# Patient Record
Sex: Male | Born: 1955 | Race: White | Hispanic: No | Marital: Married | Smoking: Former smoker
Health system: Southern US, Community
[De-identification: ages and names within clinical notes are randomized; demographics above are authoritative.]

## PROBLEM LIST (undated history)

## (undated) DIAGNOSIS — C801 Malignant (primary) neoplasm, unspecified: Secondary | ICD-10-CM

## (undated) DIAGNOSIS — I6521 Occlusion and stenosis of right carotid artery: Secondary | ICD-10-CM

## (undated) HISTORY — PX: CORONARY ANGIOPLASTY WITH STENT PLACEMENT: SHX49

---

## 2019-09-24 ENCOUNTER — Other Ambulatory Visit: Payer: Self-pay

## 2019-09-24 ENCOUNTER — Inpatient Hospital Stay (HOSPITAL_BASED_OUTPATIENT_CLINIC_OR_DEPARTMENT_OTHER)
Admission: EM | Admit: 2019-09-24 | Discharge: 2019-09-28 | DRG: 035 | Disposition: A | Discharge status: Home / Self Care | Attending: Internal Medicine | Admitting: Internal Medicine

## 2019-09-24 ENCOUNTER — Observation Stay (HOSPITAL_COMMUNITY)

## 2019-09-24 ENCOUNTER — Emergency Department (HOSPITAL_BASED_OUTPATIENT_CLINIC_OR_DEPARTMENT_OTHER)

## 2019-09-24 ENCOUNTER — Encounter (HOSPITAL_BASED_OUTPATIENT_CLINIC_OR_DEPARTMENT_OTHER): Payer: Self-pay | Admitting: *Deleted

## 2019-09-24 DIAGNOSIS — I6521 Occlusion and stenosis of right carotid artery: Secondary | ICD-10-CM | POA: Diagnosis present

## 2019-09-24 DIAGNOSIS — Z923 Personal history of irradiation: Secondary | ICD-10-CM | POA: Diagnosis not present

## 2019-09-24 DIAGNOSIS — Z87891 Personal history of nicotine dependence: Secondary | ICD-10-CM | POA: Diagnosis not present

## 2019-09-24 DIAGNOSIS — R1310 Dysphagia, unspecified: Secondary | ICD-10-CM | POA: Diagnosis not present

## 2019-09-24 DIAGNOSIS — Z85810 Personal history of malignant neoplasm of tongue: Secondary | ICD-10-CM | POA: Diagnosis present

## 2019-09-24 DIAGNOSIS — I2510 Atherosclerotic heart disease of native coronary artery without angina pectoris: Secondary | ICD-10-CM | POA: Diagnosis present

## 2019-09-24 DIAGNOSIS — R29701 NIHSS score 1: Secondary | ICD-10-CM | POA: Diagnosis present

## 2019-09-24 DIAGNOSIS — E785 Hyperlipidemia, unspecified: Secondary | ICD-10-CM

## 2019-09-24 DIAGNOSIS — Z79899 Other long term (current) drug therapy: Secondary | ICD-10-CM | POA: Diagnosis not present

## 2019-09-24 DIAGNOSIS — Z9221 Personal history of antineoplastic chemotherapy: Secondary | ICD-10-CM | POA: Diagnosis not present

## 2019-09-24 DIAGNOSIS — Z20822 Contact with and (suspected) exposure to covid-19: Secondary | ICD-10-CM | POA: Diagnosis not present

## 2019-09-24 DIAGNOSIS — N289 Disorder of kidney and ureter, unspecified: Secondary | ICD-10-CM

## 2019-09-24 DIAGNOSIS — D72829 Elevated white blood cell count, unspecified: Secondary | ICD-10-CM | POA: Diagnosis present

## 2019-09-24 DIAGNOSIS — Z955 Presence of coronary angioplasty implant and graft: Secondary | ICD-10-CM

## 2019-09-24 DIAGNOSIS — G453 Amaurosis fugax: Secondary | ICD-10-CM | POA: Diagnosis present

## 2019-09-24 DIAGNOSIS — G459 Transient cerebral ischemic attack, unspecified: Secondary | ICD-10-CM

## 2019-09-24 DIAGNOSIS — Z7982 Long term (current) use of aspirin: Secondary | ICD-10-CM

## 2019-09-24 DIAGNOSIS — I959 Hypotension, unspecified: Secondary | ICD-10-CM | POA: Diagnosis not present

## 2019-09-24 DIAGNOSIS — R7303 Prediabetes: Secondary | ICD-10-CM

## 2019-09-24 HISTORY — DX: Occlusion and stenosis of right carotid artery: I65.21

## 2019-09-24 HISTORY — DX: Malignant (primary) neoplasm, unspecified: C80.1

## 2019-09-24 LAB — COMPREHENSIVE METABOLIC PANEL
ALT: 14 U/L (ref 0–44)
AST: 17 U/L (ref 15–41)
Albumin: 3.6 g/dL (ref 3.5–5.0)
Alkaline Phosphatase: 70 U/L (ref 38–126)
Anion gap: 11 (ref 5–15)
BUN: 22 mg/dL (ref 8–23)
CO2: 24 mmol/L (ref 22–32)
Calcium: 9 mg/dL (ref 8.9–10.3)
Chloride: 104 mmol/L (ref 98–111)
Creatinine, Ser: 1.39 mg/dL — ABNORMAL HIGH (ref 0.61–1.24)
GFR calc Af Amer: >60 mL/min (ref >60)
GFR calc non Af Amer: 53 mL/min — ABNORMAL LOW (ref >60)
Glucose, Bld: 85 mg/dL (ref 70–99)
Potassium: 4 mmol/L (ref 3.5–5.1)
Sodium: 139 mmol/L (ref 135–145)
Total Bilirubin: 0.4 mg/dL (ref 0.3–1.2)
Total Protein: 7.2 g/dL (ref 6.5–8.1)

## 2019-09-24 LAB — PROTIME-INR
INR: 1 (ref 0.8–1.2)
Prothrombin Time: 12.7 seconds (ref 11.4–15.2)

## 2019-09-24 LAB — DIFFERENTIAL
Abs Immature Granulocytes: 0.06 10*3/uL (ref 0.00–0.07)
Basophils Absolute: 0 10*3/uL (ref 0.0–0.1)
Basophils Relative: 0 %
Eosinophils Absolute: 0.1 10*3/uL (ref 0.0–0.5)
Eosinophils Relative: 1 %
Immature Granulocytes: 1 %
Lymphocytes Relative: 13 %
Lymphs Abs: 1.7 10*3/uL (ref 0.7–4.0)
Monocytes Absolute: 1.1 10*3/uL — ABNORMAL HIGH (ref 0.1–1.0)
Monocytes Relative: 8 %
Neutro Abs: 10.2 10*3/uL — ABNORMAL HIGH (ref 1.7–7.7)
Neutrophils Relative %: 77 %

## 2019-09-24 LAB — CBG MONITORING, ED: Glucose-Capillary: 116 mg/dL — ABNORMAL HIGH (ref 70–99)

## 2019-09-24 LAB — CBC
HCT: 42.2 % (ref 39.0–52.0)
Hemoglobin: 13.8 g/dL (ref 13.0–17.0)
MCH: 31.9 pg (ref 26.0–34.0)
MCHC: 32.7 g/dL (ref 30.0–36.0)
MCV: 97.5 fL (ref 80.0–100.0)
Platelets: 317 10*3/uL (ref 150–400)
RBC: 4.33 MIL/uL (ref 4.22–5.81)
RDW: 13 % (ref 11.5–15.5)
WBC: 13.3 10*3/uL — ABNORMAL HIGH (ref 4.0–10.5)
nRBC: 0 % (ref 0.0–0.2)

## 2019-09-24 LAB — SARS CORONAVIRUS 2 BY RT PCR (HOSPITAL ORDER, PERFORMED IN ~~LOC~~ HOSPITAL LAB): SARS Coronavirus 2: NEGATIVE

## 2019-09-24 LAB — APTT: aPTT: 30 seconds (ref 24–36)

## 2019-09-24 LAB — ETHANOL: Alcohol, Ethyl (B): <10 mg/dL (ref <10)

## 2019-09-24 MED ORDER — ACETAMINOPHEN 650 MG RE SUPP: 650.0000 mg | RECTAL | Status: DC | PRN

## 2019-09-24 MED ORDER — ACETAMINOPHEN 325 MG PO TABS
650.0000 mg | ORAL_TABLET | ORAL | Status: DC | PRN
  Administered 2019-09-26 – 2019-09-27 (×2): 650 mg via ORAL
  Filled 2019-09-24 (×2): qty 2

## 2019-09-24 MED ORDER — ASPIRIN 81 MG PO CHEW
81.0000 mg | CHEWABLE_TABLET | Freq: Every day | ORAL | Status: DC
  Administered 2019-09-25 – 2019-09-28 (×3): 81 mg via ORAL
  Filled 2019-09-24 (×3): qty 1

## 2019-09-24 MED ORDER — SENNOSIDES-DOCUSATE SODIUM 8.6-50 MG PO TABS: 1.0000 | ORAL_TABLET | Freq: Every evening | ORAL | Status: DC | PRN

## 2019-09-24 MED ORDER — ENOXAPARIN SODIUM 40 MG/0.4ML ~~LOC~~ SOLN
40.0000 mg | SUBCUTANEOUS | Status: DC
  Administered 2019-09-24 – 2019-09-27 (×4): 40 mg via SUBCUTANEOUS
  Filled 2019-09-24 (×4): qty 0.4

## 2019-09-24 MED ORDER — STROKE: EARLY STAGES OF RECOVERY BOOK
Freq: Once | Status: AC
  Filled 2019-09-24: qty 1

## 2019-09-24 MED ORDER — ACETAMINOPHEN 160 MG/5ML PO SOLN: 650.0000 mg | ORAL | Status: DC | PRN

## 2019-09-24 MED ORDER — ASPIRIN 81 MG PO CHEW
324.0000 mg | CHEWABLE_TABLET | Freq: Once | ORAL | Status: AC
  Administered 2019-09-24: 324 mg via ORAL
  Filled 2019-09-24: qty 4

## 2019-09-24 MED ORDER — SODIUM CHLORIDE 0.9 % IV SOLN: INTRAVENOUS | Status: AC

## 2019-09-24 MED ORDER — GADOBUTROL 1 MMOL/ML IV SOLN
6.0000 mL | Freq: Once | INTRAVENOUS | Status: AC | PRN
  Administered 2019-09-24: 6 mL via INTRAVENOUS

## 2019-09-24 NOTE — Progress Notes (Signed)
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 Leaving patient NPO until speech assesses further.

## 2019-09-24 NOTE — H&P (Signed)
History and Physical    Peter Jordan YJE:[id number] DOB: [birth year] DOA: 09/24/2019  PCP: Patient, No Pcp Per   Patient coming from: Home   Chief Complaint: Vision loss in right eye   HPI: Peter Jordan is a 64 y.o. male with medical history significant for tongue cancer, now presenting to the emergency department for evaluation of vision deficit.  Patient reports that he has been experiencing frequent episodes of right facial numbness that he attributes to prior neck surgeries but then had an episode today involving loss of right eye vision.  He describes loss of upper visual field with the right eye that lasted approximately 15 minutes before resolving.  He has had this previously, most recently 1 month ago, also involving the right eye and lasting 15 to 20 minutes.  He denies any chest pain or palpitations.  He denies any focal numbness or weakness other than the chronic right facial numbness.  He denies any headache.  Patient denies fevers, chills, cough, or shortness of breath.  [facility] ED Course: Upon arrival to the ED, patient is found to be afebrile, saturating well on room air, and with stable blood pressure.  EKG features sinus tachycardia with rate 106.  Head CT is negative for hemorrhage or infarction but notable for prominent CSF at the convexities which could be a normal variant or subdural hygromas.  Chemistry panel notable for creatinine 1.39, up from 0.9 more than a year ago.  CBC notable for leukocytosis to 13,300.  Covid PCR was negative.  Teleneurology recommended admission for CVA/TIA work-up.  Patient was treated with aspirin and transported to [facility].  Review of Systems:  All other systems reviewed and apart from HPI, are negative.  Past Medical History:  Diagnosis Date  . Cancer [facility])     Past Surgical History:  Procedure Laterality Date  . CORONARY ANGIOPLASTY WITH STENT PLACEMENT      Social History:   reports that he has never  smoked. He has never used smokeless tobacco. He reports previous alcohol use. He reports that he does not use drugs.  Allergies  Allergen Reactions  . Penicillins     History reviewed. No pertinent family history.   Prior to Admission medications   Not on File    Physical Exam: Vitals:   09/24/19 1600 09/24/19 1615 09/24/19 1700 09/24/19 1801  BP: (!) 157/80 (!) 147/80 (!) 159/84 (!) 171/79  Pulse: 80 65 67 71  Resp: 13 14 13 16   Temp:    97.7 F (36.5 C)  TempSrc:    Oral  SpO2: 98% 98% 99% 99%  Weight:      Height:        Constitutional: NAD, calm  Eyes: PERTLA, lids and conjunctivae normal ENMT: Mucous membranes are moist. Posterior pharynx clear of any exudate or lesions.   Neck: normal, supple, no masses, no thyromegaly Respiratory: no wheezing, no crackles. No accessory muscle use.  Cardiovascular: S1 & S2 heard, regular rate and rhythm. No extremity edema.   Abdomen: No distension, no tenderness, soft. Bowel sounds active.  Musculoskeletal: no clubbing / cyanosis. No joint deformity upper and lower extremities.   Skin: no significant rashes, lesions, ulcers. Warm, dry, well-perfused. Neurologic: CN 2-12 grossly intact. Sensation to light touch diminished in right face and neck. Strength 5/5 in all 4 limbs.  Psychiatric: Alert and oriented to person, place, and situation. Pleasant and cooperative.    Labs and Imaging on Admission: I have personally reviewed following  labs and imaging studies  CBC: Recent Labs  Lab 09/24/19 1258  WBC 13.3*  NEUTROABS 10.2*  HGB 13.8  HCT 42.2  MCV 97.5  PLT 154   Basic Metabolic Panel: Recent Labs  Lab 09/24/19 1258  NA 139  K 4.0  CL 104  CO2 24  GLUCOSE 85  BUN 22  CREATININE 1.39*  CALCIUM 9.0   GFR: Estimated Creatinine Clearance: 47.5 mL/min (A) (by C-G formula based on SCr of 1.39 mg/dL (H)). Liver Function Tests: Recent Labs  Lab 09/24/19 1258  AST 17  ALT 14  ALKPHOS 70  BILITOT 0.4  PROT 7.2    ALBUMIN 3.6   No results for input(s): LIPASE, AMYLASE in the last 168 hours. No results for input(s): AMMONIA in the last 168 hours. Coagulation Profile: Recent Labs  Lab 09/24/19 1258  INR 1.0   Cardiac Enzymes: No results for input(s): CKTOTAL, CKMB, CKMBINDEX, TROPONINI in the last 168 hours. BNP (last 3 results) No results for input(s): PROBNP in the last 8760 hours. HbA1C: No results for input(s): HGBA1C in the last 72 hours. CBG: Recent Labs  Lab 09/24/19 1229  GLUCAP 116*   Lipid Profile: No results for input(s): CHOL, HDL, LDLCALC, TRIG, CHOLHDL, LDLDIRECT in the last 72 hours. Thyroid Function Tests: No results for input(s): TSH, T4TOTAL, FREET4, T3FREE, THYROIDAB in the last 72 hours. Anemia Panel: No results for input(s): VITAMINB12, FOLATE, FERRITIN, TIBC, IRON, RETICCTPCT in the last 72 hours. Urine analysis: No results found for: COLORURINE, APPEARANCEUR, LABSPEC, PHURINE, GLUCOSEU, HGBUR, BILIRUBINUR, KETONESUR, PROTEINUR, UROBILINOGEN, NITRITE, LEUKOCYTESUR Sepsis Labs: @LABRCNTIP (procalcitonin:4,lacticidven:4) ) Recent Results (from the past 240 hour(s))  SARS Coronavirus 2 by RT PCR (hospital order, performed in [facility] hospital lab) Nasopharyngeal Nasopharyngeal Swab     Status: None   Collection Time: 09/24/19 12:58 PM   Specimen: Nasopharyngeal Swab  Result Value Ref Range Status   SARS Coronavirus 2 NEGATIVE NEGATIVE Final    Comment: (NOTE) SARS-CoV-2 target nucleic acids are NOT DETECTED.  The SARS-CoV-2 RNA is generally detectable in upper and lower respiratory specimens during the acute phase of infection. The lowest concentration of SARS-CoV-2 viral copies this assay can detect is 250 copies / mL. A negative result does not preclude SARS-CoV-2 infection and should not be used as the sole basis for treatment or other patient management decisions.  A negative result may occur with improper specimen collection / handling, submission of  specimen other than nasopharyngeal swab, presence of viral mutation(s) within the areas targeted by this assay, and inadequate number of viral copies (<250 copies / mL). A negative result must be combined with clinical observations, patient history, and epidemiological information.  Fact Sheet for Patients:   StrictlyIdeas.no  Fact Sheet for Healthcare Providers: BankingDealers.co.za  This test is not yet approved or  cleared by the [place] FDA and has been authorized for detection and/or diagnosis of SARS-CoV-2 by FDA under an Emergency Use Authorization (EUA).  This EUA will remain in effect (meaning this test can be used) for the duration of the COVID-19 declaration under Section 564(b)(1) of the Act, 21 U.S.C. section 360bbb-3(b)(1), unless the authorization is terminated or revoked sooner.  Performed at [facility], [address] [place], [place] [address]      Radiological Exams on Admission: CT HEAD CODE STROKE WO CONTRAST  Result Date: 09/24/2019 CLINICAL DATA:  Code stroke. Sudden onset numbness to the right-sided face beginning this morning EXAM: CT HEAD WITHOUT CONTRAST TECHNIQUE: Contiguous axial  images were obtained from the base of the skull through the vertex without intravenous contrast. COMPARISON:  None. FINDINGS: Brain: No evidence of acute infarction, hemorrhage, hydrocephalus, or mass lesion/mass effect. Prominent CSF along cerebral convexities. There could be trace subdural hygroma on both sides towards the vertex, but no definite cortical vein mass effect. Vascular: No hyperdense vessel or unexpected calcification. Skull: Normal. Negative for fracture or focal lesion. Sinuses/Orbits: No acute finding. Other: These results were called by telephone at the time of interpretation on 09/24/2019 at 12:44 pm to provider [facility] , who verbally acknowledged these results. ASPECTS [facility] Stroke Program Early  CT Score) - Ganglionic level infarction (caudate, lentiform nuclei, internal capsule, insula, M1-M3 cortex): 7 - Supraganglionic infarction (M4-M6 cortex): 3 Total score (0-10 with 10 being normal): 10 IMPRESSION: 1. Negative for hemorrhage or visible infarct.ASPECTS is 10. 2. Prominent CSF at the convexities, normal variant versus trace subdural hygromas. Electronically Signed   By: Monte Fantasia M.D.   On: 09/24/2019 12:46    EKG: Independently reviewed. Sinus tachycardia, rate 106, non-specific ST-T abnormality.   Assessment/Plan   1. Transient monocular vision loss  - Presents after a 15-minute episode of vision loss involving right eye, was seen at [facility] with no acute hemorrhage or appreciable infarction on head CT, was treated with ASA, and now sent to [facility] for TIA/CVA workup per teleneuro recs  - He passed swallow screen on arrival to [facility]  - Continue neuro checks, cardiac monitoring  - Check MRI brain, MRA head, echocardiogram, carotid US, fasting lipids, and A1c  - Consult PT/OT/SLP, continue ASA    2. Mild renal insufficiency  - SCr is 1.39 on admission, up from 0.9 in Feb 2020  - Renally-dose medications, continue IVF hydration overnight, repeat chem panel in am     DVT prophylaxis: Lovenox  Code Status: Full  Family Communication: Discussed with patient  Disposition Plan:  Patient is from: Home  Anticipated d/c is to: Home  Anticipated d/c date is: 09/25/19 Patient currently: Pending CVA/TIA workup  Consults called: Neurology   Admission status: Observation     Vianne Bulls, MD [facility]  09/24/2019, 8:26 PM

## 2019-09-24 NOTE — Progress Notes (Signed)
[facility] to [facility] transfer:  Patient with h/o base on tongue cancer presenting to [facility] with c/o R-sided facial numbness and R visual field changes.  Concern for TIA.  Acute onset visual disturbance within an hour or two of presentation.  Teleneurologist recommends admission for TIA/CVA evaluation.  Symptoms resolved while on the teleneurology visit, she was concerned about amaurosis fugax.  Will need transfer to [facility] when bed is available - if unable to get transferred in a timely manner, it would be reasonable to send ER:ER for MRI/MRA and if negative could potentially avoid need for admission.   Carlyon Shadow, M.D.

## 2019-09-24 NOTE — ED Notes (Signed)
ED Provider at bedside. 

## 2019-09-24 NOTE — Consult Note (Signed)
[facility] TeleNeurology Consult Services   Date of Service:   09/24/2019 12:44:43  Impression:       G45.3 - Amaurosis fugax  Comments/Sign-Out: the patient has some right-sided neck numbness but this is been ongoing for the past month and is likely unrelated. Today he describes what sounds like transient monocular vision loss consistent with amaurosis. His vision is now back to baseline. Certainly stroke workup. Recommend starting aspirin. Inpatient neurology consultation. Thankfully he feels like his symptoms are resolved with the exception of some neck numbness which again is not new  Metrics: Last Known Well: 09/24/2019 11:00:00 [facility] Notification Time: 09/24/2019 12:44:43 Arrival Time: 09/24/2019 12:16:00 Stamp Time: 09/24/2019 12:44:43 Time First Login Attempt: 09/24/2019 12:49:03 Symptoms: right-sided vision change. NIHSS Start Assessment Time: 09/24/2019 12:51:47 Patient is not a candidate for Thrombolytic. Thrombolytic Medical Decision: 09/24/2019 13:05:39 Patient was not deemed candidate for Thrombolytic because of following reasons: No disabling symptoms.  CT head showed no acute hemorrhage or acute core infarct.  Radiologist was not called back for review of advanced imaging. ED Physician notified of diagnostic impression and management plan on 09/24/2019 13:07:57  Advanced Imaging: Advanced Imaging Not Recommended because:  Clinical Presentation is not Suggestive of LVO and NIHSS is <6   Our recommendations are outlined below.  Recommendations:       Activate Stroke Protocol Admission/Order Set       Stroke/Telemetry Floor       Neuro Checks       Bedside Swallow Eval       DVT Prophylaxis       IV Fluids, Normal Saline       Head of Bed 30 Degrees       Euglycemia and Avoid Hyperthermia (PRN Acetaminophen)       Initiate Aspirin 81 MG Daily  Routine Consultation with [facility] for Follow up Care  Sign Out:        Discussed with Emergency Department Provider    ------------------------------------------------------------------------------  History of Present Illness: Patient is a 64 year old Male.  Patient was brought by private transportation with symptoms of right-sided vision change.  At 11am sudden right sided facial numbness and vision changes. He has CAD s/p stents. Had cancer 7years ago in teh neck. had ulcer in this He has what sounds like a right eye amourosis but also some. Hes had some intermittent numbness to the right face/nekc ofr the past month. the patient is a pleasant 64 year old gentleman who has a known history of coronary artery disease not on any antiplatelet secondary to gastric ulcers in the past. He's had some intermittent right-sided neck and face numbness for the past month. Today at 11 AM he developed right-sided vision change which she describes as a darkness coming down in the right eye only able to see the lower portion of the vision. He does not feel like the left eye was affected.   Past Medical History:      Coronary Artery Disease      There is NO history of Hypertension      There is NO history of Diabetes Mellitus      There is NO history of Hyperlipidemia      There is NO history of Atrial Fibrillation      There is NO history of Stroke  Anticoagulant use:  No  Antiplatelet use: No    Examination: BP(65/85), Pulse(75), Blood Glucose(116) 1A: Level of Consciousness - Alert; keenly responsive + 0 1B: Ask Month and Age - Both  Questions Right + 0 1C: Blink Eyes & Squeeze Hands - Performs Both Tasks + 0 2: Test Horizontal Extraocular Movements - Normal + 0 3: Test Visual Fields - No Visual Loss + 0 4: Test Facial Palsy (Use Grimace if Obtunded) - Normal symmetry + 0 5A: Test Left Arm Motor Drift - No Drift for 10 Seconds + 0 5B: Test Right Arm Motor Drift - No Drift for 10 Seconds + 0 6A: Test Left Leg Motor Drift - No Drift for 5 Seconds + 0 6B:  Test Right Leg Motor Drift - No Drift for 5 Seconds + 0 7: Test Limb Ataxia (FNF/Heel-Shin) - No Ataxia + 0 8: Test Sensation - Mild-Moderate Loss: Less Sharp/More Dull + 1 9: Test Language/Aphasia - Normal; No aphasia + 0 10: Test Dysarthria - Normal + 0 11: Test Extinction/Inattention - No abnormality + 0  NIHSS Score: 1  Pre-Morbid Modified Rankin Scale: 0 Points = No symptoms at all   Patient/Family was informed the Neurology Consult would occur via TeleHealth consult by way of interactive audio and video telecommunications and consented to receiving care in this manner.   Patient is being evaluated for possible acute neurologic impairment and high probability of imminent or life-threatening deterioration. I spent total of 35 minutes providing care to this patient, including time for face to face visit via telemedicine, review of medical records, imaging studies and discussion of findings with providers, the patient and/or family.   Dr Katina Degree   [facility] [phone]  Case [id number]

## 2019-09-24 NOTE — ED Triage Notes (Addendum)
Pt reports sudden onset numbness to the right side of his face at 11am. No droop with smile, slight right eye droop without eye weakness.  Pt reports dizziness and CP that began at the same time. Also reports visual field changes in right eye.

## 2019-09-24 NOTE — Consult Note (Addendum)
Neurology Consultation Reason for Consult: Transient vision loss Referring Physician: Christia Reading Opyd  CC: Loss of vision in the right eye, transient.   History is obtained from: Patient   HPI: Peter Jordan is a 64 y.o. male with a PMHx significant for CAD (s/p right coronary stent in 2011), Tongue cancer s/p resection including bilateral neck surgeries (2013) and an 8-week course of chemotherapy/radiation (2014, involved bilateral neck), GI bleed felt to be secondary to aspirin and ibuprofen (requiring hospitalization and 4 units of blood and 2012, 2 units of blood in 2017), 30-pack-year smoking history (quit in 2013).  He presents with an episode of right eye vision loss described as a curtain falling halfway down his eye lasting 15 to 20 minutes before resolving.  He feels that this may have happened 1-2 times additionally over the past 2 weeks but also short-lived and resolved with rubbing his eye that he did not think about it until today.   He otherwise has some longstanding tingling of the right side of his face/neck when he turns his head to the right.  This is unchanged.  He additionally has chronic tinnitus and a stable cough.  Otherwise his review of systems is negative for any difficulty with his speech, swallow, double vision, headaches (had a brief typical headache this morning for which he took Tylenol which he attributes to computer use and happens regularly 2-3 times a month), congestion, palpitations, chest pain, GI/GU symptoms including diarrhea/constipation/hematuria/dysuria, rashes, fevers, chills, proximal muscle weakness, jaw claudication, scalp tenderness  Vitals notable for BP 140s-170s/80s-90s  LKW: 11 AM on 9/6  tPA given?: No, due to symptoms fully resolved and out of the window   ROS: A 14 point ROS was performed and is negative except as noted in the HPI.   Past Medical History:  Diagnosis Date  . Cancer [facility])   -See details above in HPI  History reviewed. No  pertinent family history.  Social History:  reports that he has never smoked. He has never used smokeless tobacco. He reports previous alcohol use. He reports that he does not use drugs.  Exam: Current vital signs: BP (!) 171/79 (BP Location: Left Arm)   Pulse 71   Temp 97.7 F (36.5 C) (Oral)   Resp 16   Ht _0  (1.702 m)   Wt 62.6 kg   SpO2 99%   BMI 21.61 kg/m  Vital signs in last 24 hours: Temp:  [97.7 F (36.5 C)-97.9 F (36.6 C)] 97.7 F (36.5 C) (09/06 1801) Pulse Rate:  [65-96] 71 (09/06 1801) Resp:  [10-27] 16 (09/06 1801) BP: (125-177)/(78-98) 171/79 (09/06 1801) SpO2:  [96 %-99 %] 99 % (09/06 1801) Weight:  [62.6 kg] 62.6 kg (09/06 1225)  Physical Exam  Constitutional: Appears well-developed and well-nourished.  Psych: Affect appropriate to situation, cooperative and pleasant, jovial  Eyes: No scleral injection HENT: No OP obstruction  MSK: right pinkie flexion deformity and other milder deformities of right hand fingers Cardiovascular: Normal rate and regular rhythm.  Respiratory: Effort normal, non-labored breathing GI: Soft.  No distension. There is no tenderness.  Skin: minor bruising  Neuro: Mental Status: Patient is awake, alert, oriented to person, place, month, year, and situation. Patient is able to give a clear and coherent history. No signs of aphasia or neglect, able to quickly name days of the week backwards, follow complex commands, name and repeat.  Cranial Nerves: II: Visual Fields are full. Pupils are equal, round, and reactive to light. No APD  III,IV,  VI: EOMI without ptosis or diploplia.  V: Facial sensation is reduced in the right V2/V3 distributions (chronic) VII: Facial movement is symmetric.  VIII: hearing is intact to voice X: Uvula absent XI: Shoulder shrug is symmetric. XII: tongue is twisted slightly more to the left than the right.  Motor: Tone is normal. Bulk is normal. 5/5 strength was present in all four extremities  other than right pinkie  Sensory: Sensation is symmetric to light touch in the arms and legs, some stocking/glove loss of temperature sensation symmetric in bilateral upper and lower extremities.  Deep Tendon Reflexes: 3+ and brisker on the right in biceps and brachioradialis Ticklish full body jerk to bilateral patellar reflexes but likely also brisk 2+ patellars Plantars Toes are downgoing bilaterally.  Cerebellar: FNF and HKS with mild end reach past pointing on the right arm only  I have reviewed labs in epic and the results pertinent to this consultation are: Cr 1.39, GFR 53, WBC 13.3  I have reviewed the images obtained:   MRI brain and MRA head and neck   - chronic microvascular disease more marked on the left side  - severe stenosis of the right carotid   Impression: Mr. Mehring is a 64 year old gentleman with multiple stroke risk factors as detailed above presenting with transient loss of vision in the right eye.  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  He does not complain about symptoms suggestive of giant cell arteritis, but will also check an ESR (notably an elevated ESR would also be potentially concerning for recurrent malignancy).   Recommendations: - ESR, HgbA1c, fasting lipid panel - MRI, MRA of the brain without contrast, MRA neck w/ and w/o contrast  - Carotid duplex to confirm stenosis - Frequent neuro checks - Echocardiogram - Antiplatelet med: Aspirin - dose 36m PO received at 2:40 PM on 9/6, continue 81 mg daily - Start statin pending lipid panel (40-80 mg atorvastatin), goal LDL less than 70 - Risk factor modification - Telemetry monitoring - PT consult, OT consult, Speech consult not indicated given back to baseline - Consult vascular surgery in the morning - Stroke team to follow  [phone]

## 2019-09-24 NOTE — ED Provider Notes (Signed)
[facility] Provider Note   CSN: [id number] Arrival date & time: 09/24/19  1216  An emergency department physician performed an initial assessment on this suspected stroke patient at 1242.  History Chief Complaint  Patient presents with  . Code Stroke    Peter Jordan is a 64 y.o. male.  HPI   Patient presented to the ED for evaluation of acute onset of facial numbness and visual disturbance.  Patient states his symptoms started about 11 AM this morning.  He noted that the right side of his face felt numb.  He also started having difficulty with vision in his right eye primarily in the upper outer aspect.  Patient felt like he could not see anything in that area.  He denies any weakness.  He has not had any speech disturbance.  He denies any numbness or weakness in his extremities or difficulty with his balance or gait  Past Medical History:  Diagnosis Date  . Cancer ([place])     There are no problems to display for this patient.   Past Surgical History:  Procedure Laterality Date  . CORONARY ANGIOPLASTY WITH STENT PLACEMENT         History reviewed. No pertinent family history.  Social History   Tobacco Use  . Smoking status: Never Smoker  . Smokeless tobacco: Never Used  Substance Use Topics  . Alcohol use: Not Currently  . Drug use: Never    Home Medications Prior to Admission medications   Not on File    Allergies    Penicillins  Review of Systems   Review of Systems  All other systems reviewed and are negative.   Physical Exam Updated Vital Signs BP (!) 162/86   Pulse 67   Temp 97.9 F (36.6 C) (Oral)   Resp 14   Ht 1.702 m (5\' 7" )   Wt 62.6 kg   SpO2 97%   BMI 21.61 kg/m   Physical Exam Vitals and nursing note reviewed.  Constitutional:      General: He is not in acute distress.    Appearance: He is well-developed.  HENT:     Head: Normocephalic and atraumatic.     Right Ear: External ear normal.      Left Ear: External ear normal.  Eyes:     General: No scleral icterus.       Right eye: No discharge.        Left eye: No discharge.     Conjunctiva/sclera: Conjunctivae normal.  Neck:     Trachea: No tracheal deviation.  Cardiovascular:     Rate and Rhythm: Normal rate and regular rhythm.  Pulmonary:     Effort: Pulmonary effort is normal. No respiratory distress.     Breath sounds: Normal breath sounds. No stridor. No wheezing or rales.  Abdominal:     General: Bowel sounds are normal. There is no distension.     Palpations: Abdomen is soft.     Tenderness: There is no abdominal tenderness. There is no guarding or rebound.  Musculoskeletal:        General: No tenderness.     Cervical back: Neck supple.  Skin:    General: Skin is warm and dry.     Findings: No rash.  Neurological:     Mental Status: He is alert and oriented to person, place, and time.     Cranial Nerves: No cranial nerve deficit (No facial droop, extraocular movements intact, tongue midline ).  Sensory: No sensory deficit.     Motor: No abnormal muscle tone or seizure activity.     Coordination: Coordination normal.     Comments: No pronator drift bilateral upper extrem, able to hold both legs off bed for 5 seconds, sensation intact in all extremities, right upper outer visual field cuts, no left or right sided neglect, normal finger-nose exam bilaterally, no nystagmus noted      ED Results / Procedures / Treatments   Labs (all labs ordered are listed, but only abnormal results are displayed) Labs Reviewed  CBC - Abnormal; Notable for the following components:      Result Value   WBC 13.3 (*)    All other components within normal limits  DIFFERENTIAL - Abnormal; Notable for the following components:   Neutro Abs 10.2 (*)    Monocytes Absolute 1.1 (*)    All other components within normal limits  COMPREHENSIVE METABOLIC PANEL - Abnormal; Notable for the following components:   Creatinine, Ser 1.39  (*)    GFR calc non Af Amer 53 (*)    All other components within normal limits  CBG MONITORING, ED - Abnormal; Notable for the following components:   Glucose-Capillary 116 (*)    All other components within normal limits  SARS CORONAVIRUS 2 BY RT PCR [facility] ORDER, [facility])  ETHANOL  PROTIME-INR  APTT    EKG EKG Interpretation  Date/Time:  Monday September 24 2019 12:24:56 EDT Ventricular Rate:  106 PR Interval:  122 QRS Duration: 76 QT Interval:  376 QTC Calculation: 499 R Axis:   49 Text Interpretation: Sinus tachycardia Nonspecific ST and T wave abnormality Abnormal ECG No old tracing to compare Confirmed by Dorie Rank [phone]) on 09/24/2019 12:34:47 PM   Radiology CT HEAD CODE STROKE WO CONTRAST  Result Date: 09/24/2019 CLINICAL DATA:  Code stroke. Sudden onset numbness to the right-sided face beginning this morning EXAM: CT HEAD WITHOUT CONTRAST TECHNIQUE: Contiguous axial images were obtained from the base of the skull through the vertex without intravenous contrast. COMPARISON:  None. FINDINGS: Brain: No evidence of acute infarction, hemorrhage, hydrocephalus, or mass lesion/mass effect. Prominent CSF along cerebral convexities. There could be trace subdural hygroma on both sides towards the vertex, but no definite cortical vein mass effect. Vascular: No hyperdense vessel or unexpected calcification. Skull: Normal. Negative for fracture or focal lesion. Sinuses/Orbits: No acute finding. Other: These results were called by telephone at the time of interpretation on 09/24/2019 at 12:44 pm to provider [facility] , who verbally acknowledged these results. ASPECTS [facility] Stroke Program Early CT Score) - Ganglionic level infarction (caudate, lentiform nuclei, internal capsule, insula, M1-M3 cortex): 7 - Supraganglionic infarction (M4-M6 cortex): 3 Total score (0-10 with 10 being normal): 10 IMPRESSION: 1. Negative for hemorrhage or visible infarct.ASPECTS is  10. 2. Prominent CSF at the convexities, normal variant versus trace subdural hygromas. Electronically Signed   By: Monte Fantasia M.D.   On: 09/24/2019 12:46    Procedures Procedures (including critical care time)  Medications Ordered in ED Medications  aspirin chewable tablet 324 mg (324 mg Oral Given 09/24/19 1440)    ED Course  I have reviewed the triage vital signs and the nursing notes.  Pertinent labs & imaging results that were available during my care of the patient were reviewed by me and considered in my medical decision making (see chart for details).  Clinical Course as of Sep 24 1534  Mon Sep 24, 2019  1429  Labs reviewed.  Slight increase in white blood cell count.  Creatinine slightly elevated otherwise labs unremarkable.   [XI]  3568 CT without acute findings   [JK]    Clinical Course User Index [JK] Dorie Rank, MD   MDM Rules/Calculators/A&P                         Patient presented to the ED with complaints of acute visual changes.  Initially patient indicated he was having numbness but upon further discussion appeared that was ongoing for a longer period of time.  Code stroke was activated when the patient arrived.  Patient symptoms fortunately improved and his visual disturbance resolved.  Patient was evaluated by Dr. Holland Commons, neurology.  Possible amaurosis fugax.  tPA isnot indicated.  Admission for further stroke work-up was recommended.  I discussed this with the patient and he agrees.  I will consult for admission at [facility].   Final Clinical Impression(s) / ED Diagnoses Final diagnoses:  TIA (transient ischemic attack)    Rx / DC Orders ED Discharge Orders    None       Dorie Rank, MD 09/24/19 1536

## 2019-09-25 ENCOUNTER — Inpatient Hospital Stay (HOSPITAL_COMMUNITY)

## 2019-09-25 ENCOUNTER — Observation Stay (HOSPITAL_COMMUNITY)

## 2019-09-25 DIAGNOSIS — I959 Hypotension, unspecified: Secondary | ICD-10-CM | POA: Diagnosis not present

## 2019-09-25 DIAGNOSIS — Z7982 Long term (current) use of aspirin: Secondary | ICD-10-CM | POA: Diagnosis not present

## 2019-09-25 DIAGNOSIS — I340 Nonrheumatic mitral (valve) insufficiency: Secondary | ICD-10-CM

## 2019-09-25 DIAGNOSIS — Z85810 Personal history of malignant neoplasm of tongue: Secondary | ICD-10-CM | POA: Diagnosis not present

## 2019-09-25 DIAGNOSIS — Z955 Presence of coronary angioplasty implant and graft: Secondary | ICD-10-CM | POA: Diagnosis not present

## 2019-09-25 DIAGNOSIS — Z20822 Contact with and (suspected) exposure to covid-19: Secondary | ICD-10-CM | POA: Diagnosis present

## 2019-09-25 DIAGNOSIS — E785 Hyperlipidemia, unspecified: Secondary | ICD-10-CM

## 2019-09-25 DIAGNOSIS — I6521 Occlusion and stenosis of right carotid artery: Secondary | ICD-10-CM

## 2019-09-25 DIAGNOSIS — R7303 Prediabetes: Secondary | ICD-10-CM | POA: Diagnosis present

## 2019-09-25 DIAGNOSIS — D72829 Elevated white blood cell count, unspecified: Secondary | ICD-10-CM | POA: Diagnosis present

## 2019-09-25 DIAGNOSIS — G453 Amaurosis fugax: Secondary | ICD-10-CM

## 2019-09-25 DIAGNOSIS — Z79899 Other long term (current) drug therapy: Secondary | ICD-10-CM | POA: Diagnosis not present

## 2019-09-25 DIAGNOSIS — I2510 Atherosclerotic heart disease of native coronary artery without angina pectoris: Secondary | ICD-10-CM

## 2019-09-25 DIAGNOSIS — R1310 Dysphagia, unspecified: Secondary | ICD-10-CM | POA: Diagnosis present

## 2019-09-25 DIAGNOSIS — Z9221 Personal history of antineoplastic chemotherapy: Secondary | ICD-10-CM | POA: Diagnosis not present

## 2019-09-25 DIAGNOSIS — N289 Disorder of kidney and ureter, unspecified: Secondary | ICD-10-CM | POA: Diagnosis present

## 2019-09-25 DIAGNOSIS — Z87891 Personal history of nicotine dependence: Secondary | ICD-10-CM | POA: Diagnosis not present

## 2019-09-25 DIAGNOSIS — Z923 Personal history of irradiation: Secondary | ICD-10-CM | POA: Diagnosis not present

## 2019-09-25 DIAGNOSIS — R29701 NIHSS score 1: Secondary | ICD-10-CM | POA: Diagnosis present

## 2019-09-25 LAB — CBC
HCT: 47.5 % (ref 39.0–52.0)
Hemoglobin: 15.7 g/dL (ref 13.0–17.0)
MCH: 31.7 pg (ref 26.0–34.0)
MCHC: 33.1 g/dL (ref 30.0–36.0)
MCV: 96 fL (ref 80.0–100.0)
Platelets: 328 10*3/uL (ref 150–400)
RBC: 4.95 MIL/uL (ref 4.22–5.81)
RDW: 13 % (ref 11.5–15.5)
WBC: 7.3 10*3/uL (ref 4.0–10.5)
nRBC: 0 % (ref 0.0–0.2)

## 2019-09-25 LAB — HEMOGLOBIN A1C
Hgb A1c MFr Bld: 5.9 % — ABNORMAL HIGH (ref 4.8–5.6)
Mean Plasma Glucose: 122.63 mg/dL

## 2019-09-25 LAB — ECHOCARDIOGRAM COMPLETE
Area-P 1/2: 3.48 cm2
Height: 67 in
Radius: 0.4 cm
S' Lateral: 4 cm
Weight: 2208 oz

## 2019-09-25 LAB — BASIC METABOLIC PANEL
Anion gap: 11 (ref 5–15)
BUN: 14 mg/dL (ref 8–23)
CO2: 26 mmol/L (ref 22–32)
Calcium: 9.5 mg/dL (ref 8.9–10.3)
Chloride: 103 mmol/L (ref 98–111)
Creatinine, Ser: 1.2 mg/dL (ref 0.61–1.24)
GFR calc Af Amer: >60 mL/min (ref >60)
GFR calc non Af Amer: >60 mL/min (ref >60)
Glucose, Bld: 87 mg/dL (ref 70–99)
Potassium: 4 mmol/L (ref 3.5–5.1)
Sodium: 140 mmol/L (ref 135–145)

## 2019-09-25 LAB — LIPID PANEL
Cholesterol: 265 mg/dL — ABNORMAL HIGH (ref 0–200)
HDL: 65 mg/dL (ref >40)
LDL Cholesterol: 171 mg/dL — ABNORMAL HIGH (ref 0–99)
Total CHOL/HDL Ratio: 4.1 RATIO
Triglycerides: 146 mg/dL (ref <150)
VLDL: 29 mg/dL (ref 0–40)

## 2019-09-25 LAB — SEDIMENTATION RATE: Sed Rate: 30 mm/hr — ABNORMAL HIGH (ref 0–16)

## 2019-09-25 LAB — HIV ANTIBODY (ROUTINE TESTING W REFLEX): HIV Screen 4th Generation wRfx: NONREACTIVE

## 2019-09-25 MED ORDER — IOPAMIDOL (ISOVUE-370) INJECTION 76%
75.0000 mL | Freq: Once | INTRAVENOUS | Status: AC | PRN
  Administered 2019-09-25: 75 mL via INTRAVENOUS

## 2019-09-25 MED ORDER — CLOPIDOGREL BISULFATE 75 MG PO TABS
75.0000 mg | ORAL_TABLET | Freq: Every day | ORAL | Status: DC
  Administered 2019-09-25 – 2019-09-28 (×3): 75 mg via ORAL
  Filled 2019-09-25 (×3): qty 1

## 2019-09-25 MED ORDER — ATORVASTATIN CALCIUM 80 MG PO TABS
80.0000 mg | ORAL_TABLET | Freq: Every day | ORAL | Status: DC
  Administered 2019-09-25 – 2019-09-28 (×3): 80 mg via ORAL
  Filled 2019-09-25 (×3): qty 1

## 2019-09-25 NOTE — Progress Notes (Signed)
STROKE TEAM PROGRESS NOTE   INTERVAL HISTORY Wife at the bedside. Pt had right eye amaurosis fugax twice for the last several weeks. The recent episode was yesterday with right eye curtain coming down for 10 min. He had previous tongue neck surgery and neck radiation. MRA and CUS showed right carotid high grade stenosis. VVS on board and ordered CTA head and neck.   Vitals:   09/24/19 2140 09/25/19 0038 09/25/19 0432 09/25/19 0805  BP: (!) 154/88 (!) 167/92 (!) 165/81 121/67  Pulse: 71 90 67 87  Resp: '16 16 17 17  ' Temp: 97.6 F (36.4 C) 97.7 F (36.5 C) 98.7 F (37.1 C) 97.9 F (36.6 C)  TempSrc: Oral Oral Oral Oral  SpO2: 97% 95% 97% 96%  Weight:      Height:       CBC:  Recent Labs  Lab 09/24/19 1258 09/25/19 0706  WBC 13.3* 7.3  NEUTROABS 10.2*  --   HGB 13.8 15.7  HCT 42.2 47.5  MCV 97.5 96.0  PLT 317 250   Basic Metabolic Panel:  Recent Labs  Lab 09/24/19 1258 09/25/19 0706  NA 139 140  K 4.0 4.0  CL 104 103  CO2 24 26  GLUCOSE 85 87  BUN 22 14  CREATININE 1.39* 1.20  CALCIUM 9.0 9.5   Lipid Panel:  Recent Labs  Lab 09/25/19 0706  CHOL 265*  TRIG 146  HDL 65  CHOLHDL 4.1  VLDL 29  LDLCALC 171*   HgbA1c:  Recent Labs  Lab 09/25/19 0706  HGBA1C 5.9*   Urine Drug Screen: No results for input(s): LABOPIA, COCAINSCRNUR, LABBENZ, AMPHETMU, THCU, LABBARB in the last 168 hours.  Alcohol Level  Recent Labs  Lab 09/24/19 1258  ETH <10    IMAGING past 24 hours MR ANGIO HEAD WO CONTRAST  Result Date: 09/24/2019 CLINICAL DATA:  Right-sided facial numbness with vision changes. EXAM: MR HEAD WITHOUT CONTRAST MR CIRCLE OF WILLIS WITHOUT CONTRAST MRA OF THE NECK WITHOUT AND WITH CONTRAST TECHNIQUE: Multiplanar, multiecho pulse sequences of the brain, circle of willis and surrounding structures were obtained without intravenous contrast. Angiographic images of the neck were obtained using MRA technique without and with intravenous contrast. CONTRAST:  25m  GADAVIST GADOBUTROL 1 MMOL/ML IV SOLN COMPARISON:  None. FINDINGS: MR HEAD FINDINGS Brain: No acute infarct, acute hemorrhage or extra-axial collection. Multifocal hyperintense T2-weighted signal within the white matter. Normal volume of CSF spaces. Single chronic microhemorrhage from the left cerebellum. Normal midline structures. Vascular: Normal flow voids. Skull and upper cervical spine: Normal marrow signal. Sinuses/Orbits: Negative. Other: None. MR CIRCLE OF WILLIS FINDINGS POSTERIOR CIRCULATION: --Vertebral arteries: Normal V4 segments. --Inferior cerebellar arteries: Normal. --Basilar artery: Normal. --Superior cerebellar arteries: Normal. --Posterior cerebral arteries: Normal. ANTERIOR CIRCULATION: --Intracranial internal carotid arteries: Normal. --Anterior cerebral arteries (ACA): Normal. Both A1 segments are present. Patent anterior communicating artery (a-comm). --Middle cerebral arteries (MCA): Normal. MRA NECK FINDINGS There is severe stenosis of the proximal right internal carotid artery (series 1096, image 165). The remainder of the ICA is normal. The left carotid system is normal. Vertebral system is codominant. There stenosis of the left vertebral artery origin. Otherwise the vertebral arteries normal. IMPRESSION: 1. No acute intracranial abnormality. 2. Normal intracranial MRA. 3. 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 4. Left vertebral artery origin narrowing. Electronically Signed   By: KUlyses JarredM.D.   On: 09/24/2019 22:04   MR ANGIO NECK W WO CONTRAST  Result Date: 09/24/2019 CLINICAL DATA:  Right-sided facial numbness with vision changes. EXAM: MR HEAD WITHOUT CONTRAST MR CIRCLE OF WILLIS WITHOUT CONTRAST MRA OF THE NECK WITHOUT AND WITH CONTRAST TECHNIQUE: Multiplanar, multiecho pulse sequences of the brain, circle of willis and surrounding structures were obtained without intravenous contrast. Angiographic images of the neck were obtained using MRA technique  without and with intravenous contrast. CONTRAST:  50m GADAVIST GADOBUTROL 1 MMOL/ML IV SOLN COMPARISON:  None. FINDINGS: MR HEAD FINDINGS Brain: No acute infarct, acute hemorrhage or extra-axial collection. Multifocal hyperintense T2-weighted signal within the white matter. Normal volume of CSF spaces. Single chronic microhemorrhage from the left cerebellum. Normal midline structures. Vascular: Normal flow voids. Skull and upper cervical spine: Normal marrow signal. Sinuses/Orbits: Negative. Other: None. MR CIRCLE OF WILLIS FINDINGS POSTERIOR CIRCULATION: --Vertebral arteries: Normal V4 segments. --Inferior cerebellar arteries: Normal. --Basilar artery: Normal. --Superior cerebellar arteries: Normal. --Posterior cerebral arteries: Normal. ANTERIOR CIRCULATION: --Intracranial internal carotid arteries: Normal. --Anterior cerebral arteries (ACA): Normal. Both A1 segments are present. Patent anterior communicating artery (a-comm). --Middle cerebral arteries (MCA): Normal. MRA NECK FINDINGS There is severe stenosis of the proximal right internal carotid artery (series 1096, image 165). The remainder of the ICA is normal. The left carotid system is normal. Vertebral system is codominant. There stenosis of the left vertebral artery origin. Otherwise the vertebral arteries normal. IMPRESSION: 1. No acute intracranial abnormality. 2. Normal intracranial MRA. 3. 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 4. Left vertebral artery origin narrowing. Electronically Signed   By: KUlyses JarredM.D.   On: 09/24/2019 22:04   MR BRAIN WO CONTRAST  Result Date: 09/24/2019 CLINICAL DATA:  Right-sided facial numbness with vision changes. EXAM: MR HEAD WITHOUT CONTRAST MR CIRCLE OF WILLIS WITHOUT CONTRAST MRA OF THE NECK WITHOUT AND WITH CONTRAST TECHNIQUE: Multiplanar, multiecho pulse sequences of the brain, circle of willis and surrounding structures were obtained without intravenous contrast. Angiographic images  of the neck were obtained using MRA technique without and with intravenous contrast. CONTRAST:  649mGADAVIST GADOBUTROL 1 MMOL/ML IV SOLN COMPARISON:  None. FINDINGS: MR HEAD FINDINGS Brain: No acute infarct, acute hemorrhage or extra-axial collection. Multifocal hyperintense T2-weighted signal within the white matter. Normal volume of CSF spaces. Single chronic microhemorrhage from the left cerebellum. Normal midline structures. Vascular: Normal flow voids. Skull and upper cervical spine: Normal marrow signal. Sinuses/Orbits: Negative. Other: None. MR CIRCLE OF WILLIS FINDINGS POSTERIOR CIRCULATION: --Vertebral arteries: Normal V4 segments. --Inferior cerebellar arteries: Normal. --Basilar artery: Normal. --Superior cerebellar arteries: Normal. --Posterior cerebral arteries: Normal. ANTERIOR CIRCULATION: --Intracranial internal carotid arteries: Normal. --Anterior cerebral arteries (ACA): Normal. Both A1 segments are present. Patent anterior communicating artery (a-comm). --Middle cerebral arteries (MCA): Normal. MRA NECK FINDINGS There is severe stenosis of the proximal right internal carotid artery (series 1096, image 165). The remainder of the ICA is normal. The left carotid system is normal. Vertebral system is codominant. There stenosis of the left vertebral artery origin. Otherwise the vertebral arteries normal. IMPRESSION: 1. No acute intracranial abnormality. 2. Normal intracranial MRA. 3. 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 4. Left vertebral artery origin narrowing. Electronically Signed   By: KeUlyses Jarred.D.   On: 09/24/2019 22:04   CT HEAD CODE STROKE WO CONTRAST  Result Date: 09/24/2019 CLINICAL DATA:  Code stroke. Sudden onset numbness to the right-sided face beginning this morning EXAM: CT HEAD WITHOUT CONTRAST TECHNIQUE: Contiguous axial images were obtained from the base of the skull through the vertex without intravenous contrast. COMPARISON:  None. FINDINGS: Brain:  No evidence of acute infarction, hemorrhage, hydrocephalus, or mass lesion/mass effect. Prominent CSF along cerebral convexities. There could be trace subdural hygroma on both sides towards the vertex, but no definite cortical vein mass effect. Vascular: No hyperdense vessel or unexpected calcification. Skull: Normal. Negative for fracture or focal lesion. Sinuses/Orbits: No acute finding. Other: These results were called by telephone at the time of interpretation on 09/24/2019 at 12:44 pm to provider [facility] , who verbally acknowledged these results. ASPECTS [facility] Stroke Program Early CT Score) - Ganglionic level infarction (caudate, lentiform nuclei, internal capsule, insula, M1-M3 cortex): 7 - Supraganglionic infarction (M4-M6 cortex): 3 Total score (0-10 with 10 being normal): 10 IMPRESSION: 1. Negative for hemorrhage or visible infarct.ASPECTS is 10. 2. Prominent CSF at the convexities, normal variant versus trace subdural hygromas. Electronically Signed   By: Monte Fantasia M.D.   On: 09/24/2019 12:46    PHYSICAL EXAM  Temp:  [97.6 F (36.4 C)-98.7 F (37.1 C)] 97.8 F (36.6 C) (09/07 1221) Pulse Rate:  [65-103] 103 (09/07 1221) Resp:  [13-17] 17 (09/07 1221) BP: (104-171)/(59-92) 104/59 (09/07 1221) SpO2:  [95 %-99 %] 97 % (09/07 1221)  General - Well nourished, well developed, in no apparent distress.  Ophthalmologic - fundi not visualized due to noncooperation.  Cardiovascular - Regular rhythm and rate.  Mental Status -  Level of arousal and orientation to time, place, and person were intact. Language including expression, naming, repetition, comprehension was assessed and found intact. Fund of Knowledge was assessed and was intact.  Cranial Nerves II - XII - II - Visual field intact OU. III, IV, VI - Extraocular movements intact. V - Chronic right lower facial decreased sensation due to previous surgery and radiation VII - Facial movement intact bilaterally. VIII - Hearing  & vestibular intact bilaterally. X - Palate elevates symmetrically. XI - Chin turning & shoulder shrug intact bilaterally. XII - Tongue protrusion with difficult for full range protrusion due to previous tongue surgery.  Motor Strength - The patient's strength was normal in all extremities and pronator drift was absent.  Bulk was normal and fasciculations were absent.   Motor Tone - Muscle tone was assessed at the neck and appendages and was normal.  Reflexes - The patient's reflexes were symmetrical in all extremities and he had no pathological reflexes.  Sensory - Light touch, temperature/pinprick were assessed and were symmetrical.    Coordination - The patient had normal movements in the hands with no ataxia or dysmetria.  Tremor was absent.  Gait and Station - deferred.   ASSESSMENT/PLAN Mr. Peter Jordan is a 64 y.o. 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 ASPECTS 10.     MRI  No acute abnormality  MRA head Unremarkable   MRA neck severe proximal R ICA stenosis. L VA origin narrowing    Carotid Doppler  R ICA 60-79% stenosis (may be higher d/t scaring, plaque, calcifications), L ICA 40-59%  CTA head and neck pending  2D Echo EF 45-50%. No  source of embolus   LDL 171  HgbA1c 5.9  ESR 30  VTE prophylaxis - Lovenox 40 mg sq daily   No antithrombotic prior to admission, now on aspirin 81 mg daily and clopidogrel 75 mg daily.   Therapy recommendations:   Pending   Disposition:  pending   Symptomatic R Carotid Stenosis  Hx neck radiation w/ tongue cancer 2013  Recurrent OD amaurosis fugax   MRA neck severe proximal R ICA stenosis.    Carotid Doppler  R ICA 60-79% stenosis (may be higher d/t scaring, plaque, calcifications), L ICA 40-59%  CTA head and neck pending  VVS consulted - Scot Dock on board to decide on TCAR vs. 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 After hours, contact General Neurology

## 2019-09-25 NOTE — Consult Note (Addendum)
VASCULAR SURGERY ASSESSMENT & PLAN:   SYMPTOMATIC RIGHT CAROTID STENOSIS: This patient has had 2 episodes of amaurosis fugax involving the right eye.  He has a tight right carotid stenosis.  He is now on aspirin, Plavix, and a statin.  He was not on aspirin when this recent episode occurred.  I would recommend addressing the right carotid stenosis in order to lower his risk of future stroke.  However given 2 previous operations on the right neck including a extensive neck dissection for cancer and a dissection for an abscess in the right neck 2 years ago, in addition to radiation therapy, he is not a good candidate for carotid endarterectomy.  I have ordered a CT angiogram of the head and neck to determine if he is a candidate for transcarotid stenting.  This would be ideal given his previous radiation therapy and extensive neck surgery.  If he is not a candidate for transcarotid stenting than he could potentially be considered for transfemoral stenting.  I have explained that this is associated with a slightly higher risk of periprocedural stroke.  I will make further recommendations pending the results of his CT angiogram of the head and neck.  HISTORY OF CORONARY ARTERY DISEASE: The patient is undergone previous PTCA in [place].  This was in 2011.  He is had no chest pain or chest pressure.  He is able to easily walk up a flight of steps without any shortness of breath.  I do not think he needs preoperative cardiac evaluation.  Deitra Mayo, MD Office: [phone]    History of Present Illness:  Patient is a 64 y.o. year old male who presented to the ED with a chief complaint of amaurosis and right ICA stenosis seen on MRA.   The vision loss was episodic 1-2 times over the past 2 weeks.   He denise any current vision changes, weakness in extremities or baseline changes in aphasia.   He denise history of stroke or TIA.    Past medical history includes: CAD with stenting in 2011, tongue  cancer s/p radiation and bilateral neck surgery 2013 followed by chemo/radiation treatments for 8 weeks.  He was a smoker, but quit in 2013.  GI bleed with history of blood loss anemia requiring transfusion 2012 and 2017 and does not tolerate ASA.    Past Medical History:  Diagnosis Date  . Cancer [facility])     Past Surgical History:  Procedure Laterality Date  . CORONARY ANGIOPLASTY WITH STENT PLACEMENT       Social History Social History   Tobacco Use  . Smoking status: Never Smoker  . Smokeless tobacco: Never Used  Substance Use Topics  . Alcohol use: Not Currently  . Drug use: Never    Family History History reviewed. No pertinent family history.  Allergies  Allergies  Allergen Reactions  . Aspirin Other (See Comments)  . Ibuprofen Other (See Comments)  . Penicillins Rash     Current Facility-Administered Medications  Medication Dose Route Frequency Provider Last Rate Last Admin  . acetaminophen (TYLENOL) tablet 650 mg  650 mg Oral Q4H PRN Opyd, Ilene Qua, MD       Or  . acetaminophen (TYLENOL) 160 MG/5ML solution 650 mg  650 mg Per Tube Q4H PRN Opyd, Ilene Qua, MD       Or  . acetaminophen (TYLENOL) suppository 650 mg  650 mg Rectal Q4H PRN Opyd, Ilene Qua, MD      . aspirin chewable tablet 81 mg  81 mg Oral Daily Opyd, Ilene Qua, MD   81 mg at 09/25/19 0843  . atorvastatin (LIPITOR) tablet 80 mg  80 mg Oral Daily Rosalin Hawking, MD   80 mg at 09/25/19 0844  . clopidogrel (PLAVIX) tablet 75 mg  75 mg Oral Daily Rosalin Hawking, MD   75 mg at 09/25/19 0844  . enoxaparin (LOVENOX) injection 40 mg  40 mg Subcutaneous Q24H Opyd, Ilene Qua, MD   40 mg at 09/24/19 2158  . senna-docusate (Senokot-S) tablet 1 tablet  1 tablet Oral QHS PRN Opyd, Ilene Qua, MD        ROS:   General:  No weight loss, Fever, chills  HEENT: No recent headaches, no nasal bleeding, no visual changes, no sore throat.  Horsiness to voice, swallowing difficulties baseline.    Neurologic: No  dizziness, blackouts, seizures. No recent symptoms of stroke or mini- stroke. No recent episodes of slurred speech, or temporary blindness.  Cardiac: No recent episodes of chest pain/pressure, no shortness of breath at rest.  No shortness of breath with exertion.  Denies history of atrial fibrillation or irregular heartbeat  Vascular: No history of rest pain in feet.  No history of claudication.  No history of non-healing ulcer, No history of DVT   Pulmonary: No home oxygen, no productive cough, no hemoptysis,  No asthma or wheezing  Musculoskeletal:  [ ]  Arthritis, [ ]  Low back pain,  [ ]  Joint pain  Hematologic:No history of hypercoagulable state.  No history of easy bleeding.  No history of anemia  Gastrointestinal: No hematochezia or melena,  No gastroesophageal reflux, no trouble swallowing  Urinary: [ ]  chronic Kidney disease, [ ]  on HD - [ ]  MWF or [ ]  TTHS, [ ]  Burning with urination, [ ]  Frequent urination, [ ]  Difficulty urinating;   Skin: No rashes  Psychological: No history of anxiety,  No history of depression   Physical Examination  Vitals:   09/24/19 2140 09/25/19 0038 09/25/19 0432 09/25/19 0805  BP: (!) 154/88 (!) 167/92 (!) 165/81 121/67  Pulse: 71 90 67 87  Resp: 16 16 17 17   Temp: 97.6 F (36.4 C) 97.7 F (36.5 C) 98.7 F (37.1 C) 97.9 F (36.6 C)  TempSrc: Oral Oral Oral Oral  SpO2: 97% 95% 97% 96%  Weight:      Height:        Body mass index is 21.61 kg/m.  General:  Alert and oriented, no acute distress HEENT: Normal Neck: No bruit or JVD Pulmonary: Clear to auscultation bilaterally Cardiac: Regular Rate and Rhythm without murmur Gastrointestinal: Soft, non-tender, non-distended, no mass, no scars Skin: No rash Extremity Pulses:  2+ radial,  femoral, dorsalis pedis, posterior tibial pulses bilaterally Musculoskeletal: No deformity or edema  Neurologic: Upper and lower extremity motor 5/5 and symmetric  DATA:  MRA NECK FINDINGS  There is  severe stenosis of the proximal right internal carotid artery (series 1096, image 165). The remainder of the ICA is normal. The left carotid system is normal.  Vertebral system is codominant. There stenosis of the left vertebral artery origin. Otherwise the vertebral arteries normal.  IMPRESSION: 1. No acute intracranial abnormality. 2. Normal intracranial MRA. 3. 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 4. Left vertebral artery origin narrowing.  Pending carotid duplex  ASSESSMENT:  Symptomatic right ICA stensosis  Amaurosis event x 2 in the last 2 weeks.  Current vision at baseline normal.  History of tongue cancer status post radiation and right  neck open dissection.    No evidence of stroke on MRA or CT WO contrast.   PLAN:  Dr. Scot Dock will review duplex and MRA.  He will likely be a candidate for right carotid stent secondary to history of radiation of the neck.  He is currently being managed on ASA 81 ng and Plavix.  He does have a history of GI bleed.  HGB 15.7 09/25/2019.   Roxy Horseman PA-C [facility] Office: [phone]

## 2019-09-25 NOTE — Progress Notes (Signed)
Pt stated that he was okay to take 81mg  Asprin he stated that he is not allergic if he takes too much that he gets ulcers and starts to bleed.

## 2019-09-25 NOTE — Progress Notes (Signed)
  Echocardiogram 2D Echocardiogram has been performed.  Peter Jordan 09/25/2019, 9:41 AM

## 2019-09-25 NOTE — Progress Notes (Signed)
PROGRESS NOTE    Peter Jordan  OAC:[id number] DOB: 10-24-1955 DOA: 09/24/2019 PCP: Patient, No Pcp Per   Brief Narrative: Peter Jordan is a 64 y.o. male with a history of tongue cancer status post radiation, CAD status post stent placement.  Patient presented secondary to transient right eye vision loss.  Concern for stroke on admission   Assessment & Plan:   Principal Problem:   Amaurosis fugax Active Problems:   H/O tongue cancer   Mild renal insufficiency   Amaurosis fugax Neurology consulted on admission.  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  No evidence of stroke on MRI brain.  LDL of 171.  Hemoglobin A1c of 5.9%.  Patient started on aspirin a 1 mg and Plavix 75 mg in addition to atorvastatin 80 mg daily. -Vascular surgery consult -Neurology recommendations pending today -Echocardiogram and carotid artery ultrasound are pending -Continue aspirin 81 mg and Plavix 75 mg daily -PT/OT/speech therapy consulted and recommendations are pending  Hyperlipidemia Started on Lipitor 80 mg daily here in the hospital. -Continue Lipitor 80 mg daily  Renal insufficiency Mild and unknown baseline.  Improved today.  Prediabetes Patient's hemoglobin A1c is 5.9%.  Recommend patient follow with primary care physician for possible initiation of Metformin and/or diet modification -Dietitian consult  History of tongue cancer Noted. S/p radiation therapy.   DVT prophylaxis: Lovenox Code Status:   Code Status: Full Code Family Communication: Wife at bedside Disposition Plan: Discharge likely in 2 to 3 days pending vascular surgery and neurology recommendations for management   Consultants:   Neurology  Vascular surgery  Procedures:   TRANSTHORACIC ECHOCARDIOGRAM (pending)  CAROTID DUPLEX (pending)  Antimicrobials:  None   Subjective: Patient without any vision loss today.  Patient does report that when he turns to his right he  developed symptoms of right-sided facial numbness no weakness noted.  Objective: Vitals:   09/24/19 2140 09/25/19 0038 09/25/19 0432 09/25/19 0805  BP: (!) 154/88 (!) 167/92 (!) 165/81 121/67  Pulse: 71 90 67 87  Resp: 16 16 17 17   Temp: 97.6 F (36.4 C) 97.7 F (36.5 C) 98.7 F (37.1 C) 97.9 F (36.6 C)  TempSrc: Oral Oral Oral Oral  SpO2: 97% 95% 97% 96%  Weight:      Height:        Intake/Output Summary (Last 24 hours) at 09/25/2019 1117 Last data filed at 09/25/2019 0600 Gross per 24 hour  Intake 537.5 ml  Output --  Net 537.5 ml   Filed Weights   09/24/19 1225  Weight: 62.6 kg    Examination:  General exam: Appears calm and comfortable Respiratory system: Clear to auscultation. Respiratory effort normal. Cardiovascular system: S1 & S2 heard, RRR. No murmurs, rubs, gallops or clicks. Gastrointestinal system: Abdomen is nondistended, soft and nontender. No organomegaly or masses felt. Normal bowel sounds heard. Central nervous system: Alert and oriented. No focal neurological deficits. Musculoskeletal: No edema. No calf tenderness Skin: No cyanosis. No rashes Psychiatry: Judgement and insight appear normal. Mood & affect appropriate.     Data Reviewed: I have personally reviewed following labs and imaging studies  CBC Lab Results  Component Value Date   WBC 7.3 09/25/2019   RBC 4.95 09/25/2019   HGB 15.7 09/25/2019   HCT 47.5 09/25/2019   MCV 96.0 09/25/2019   MCH 31.7 09/25/2019   PLT 328 09/25/2019   MCHC 33.1 09/25/2019   RDW 13.0 09/25/2019   LYMPHSABS 1.7 09/24/2019   MONOABS  1.1 (H) 09/24/2019   EOSABS 0.1 09/24/2019   BASOSABS 0.0 18/29/9371     Last metabolic panel Lab Results  Component Value Date   NA 140 09/25/2019   K 4.0 09/25/2019   CL 103 09/25/2019   CO2 26 09/25/2019   BUN 14 09/25/2019   CREATININE 1.20 09/25/2019   GLUCOSE 87 09/25/2019   GFRNONAA >60 09/25/2019   GFRAA >60 09/25/2019   CALCIUM 9.5 09/25/2019   PROT 7.2  09/24/2019   ALBUMIN 3.6 09/24/2019   BILITOT 0.4 09/24/2019   ALKPHOS 70 09/24/2019   AST 17 09/24/2019   ALT 14 09/24/2019   ANIONGAP 11 09/25/2019    CBG (last 3)  Recent Labs    09/24/19 1229  GLUCAP 116*     GFR: Estimated Creatinine Clearance: 55.1 mL/min (by C-G formula based on SCr of 1.2 mg/dL).  Coagulation Profile: Recent Labs  Lab 09/24/19 1258  INR 1.0    Recent Results (from the past 240 hour(s))  SARS Coronavirus 2 by RT PCR (hospital order, performed in [facility] hospital lab) Nasopharyngeal Nasopharyngeal Swab     Status: None   Collection Time: 09/24/19 12:58 PM   Specimen: Nasopharyngeal Swab  Result Value Ref Range Status   SARS Coronavirus 2 NEGATIVE NEGATIVE Final    Comment: (NOTE) SARS-CoV-2 target nucleic acids are NOT DETECTED.  The SARS-CoV-2 RNA is generally detectable in upper and lower respiratory specimens during the acute phase of infection. The lowest concentration of SARS-CoV-2 viral copies this assay can detect is 250 copies / mL. A negative result does not preclude SARS-CoV-2 infection and should not be used as the sole basis for treatment or other patient management decisions.  A negative result may occur with improper specimen collection / handling, submission of specimen other than nasopharyngeal swab, presence of viral mutation(s) within the areas targeted by this assay, and inadequate number of viral copies (<250 copies / mL). A negative result must be combined with clinical observations, patient history, and epidemiological information.  Fact Sheet for Patients:   StrictlyIdeas.no  Fact Sheet for Healthcare Providers: BankingDealers.co.za  This test is not yet approved or  cleared by the [place] FDA and has been authorized for detection and/or diagnosis of SARS-CoV-2 by FDA under an Emergency Use Authorization (EUA).  This EUA will remain in effect (meaning this test  can be used) for the duration of the COVID-19 declaration under Section 564(b)(1) of the Act, 21 U.S.C. section 360bbb-3(b)(1), unless the authorization is terminated or revoked sooner.  Performed at [facility], [address] [place], [place] [address]         Radiology Studies: MR ANGIO HEAD WO CONTRAST  Result Date: 09/24/2019 CLINICAL DATA:  Right-sided facial numbness with vision changes. EXAM: MR HEAD WITHOUT CONTRAST MR CIRCLE OF WILLIS WITHOUT CONTRAST MRA OF THE NECK WITHOUT AND WITH CONTRAST TECHNIQUE: Multiplanar, multiecho pulse sequences of the brain, circle of willis and surrounding structures were obtained without intravenous contrast. Angiographic images of the neck were obtained using MRA technique without and with intravenous contrast. CONTRAST:  8mL GADAVIST GADOBUTROL 1 MMOL/ML IV SOLN COMPARISON:  None. FINDINGS: MR HEAD FINDINGS Brain: No acute infarct, acute hemorrhage or extra-axial collection. Multifocal hyperintense T2-weighted signal within the white matter. Normal volume of CSF spaces. Single chronic microhemorrhage from the left cerebellum. Normal midline structures. Vascular: Normal flow voids. Skull and upper cervical spine: Normal marrow signal. Sinuses/Orbits: Negative. Other: None. MR CIRCLE OF WILLIS FINDINGS POSTERIOR CIRCULATION: --Vertebral  arteries: Normal V4 segments. --Inferior cerebellar arteries: Normal. --Basilar artery: Normal. --Superior cerebellar arteries: Normal. --Posterior cerebral arteries: Normal. ANTERIOR CIRCULATION: --Intracranial internal carotid arteries: Normal. --Anterior cerebral arteries (ACA): Normal. Both A1 segments are present. Patent anterior communicating artery (a-comm). --Middle cerebral arteries (MCA): Normal. MRA NECK FINDINGS There is severe stenosis of the proximal right internal carotid artery (series 1096, image 165). The remainder of the ICA is normal. The left carotid system is normal. Vertebral system is  codominant. There stenosis of the left vertebral artery origin. Otherwise the vertebral arteries normal. IMPRESSION: 1. No acute intracranial abnormality. 2. Normal intracranial MRA. 3. 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 4. Left vertebral artery origin narrowing. Electronically Signed   By: Ulyses Jarred M.D.   On: 09/24/2019 22:04   MR ANGIO NECK W WO CONTRAST  Result Date: 09/24/2019 CLINICAL DATA:  Right-sided facial numbness with vision changes. EXAM: MR HEAD WITHOUT CONTRAST MR CIRCLE OF WILLIS WITHOUT CONTRAST MRA OF THE NECK WITHOUT AND WITH CONTRAST TECHNIQUE: Multiplanar, multiecho pulse sequences of the brain, circle of willis and surrounding structures were obtained without intravenous contrast. Angiographic images of the neck were obtained using MRA technique without and with intravenous contrast. CONTRAST:  34mL GADAVIST GADOBUTROL 1 MMOL/ML IV SOLN COMPARISON:  None. FINDINGS: MR HEAD FINDINGS Brain: No acute infarct, acute hemorrhage or extra-axial collection. Multifocal hyperintense T2-weighted signal within the white matter. Normal volume of CSF spaces. Single chronic microhemorrhage from the left cerebellum. Normal midline structures. Vascular: Normal flow voids. Skull and upper cervical spine: Normal marrow signal. Sinuses/Orbits: Negative. Other: None. MR CIRCLE OF WILLIS FINDINGS POSTERIOR CIRCULATION: --Vertebral arteries: Normal V4 segments. --Inferior cerebellar arteries: Normal. --Basilar artery: Normal. --Superior cerebellar arteries: Normal. --Posterior cerebral arteries: Normal. ANTERIOR CIRCULATION: --Intracranial internal carotid arteries: Normal. --Anterior cerebral arteries (ACA): Normal. Both A1 segments are present. Patent anterior communicating artery (a-comm). --Middle cerebral arteries (MCA): Normal. MRA NECK FINDINGS There is severe stenosis of the proximal right internal carotid artery (series 1096, image 165). The remainder of the ICA is normal.  The left carotid system is normal. Vertebral system is codominant. There stenosis of the left vertebral artery origin. Otherwise the vertebral arteries normal. IMPRESSION: 1. No acute intracranial abnormality. 2. Normal intracranial MRA. 3. 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 4. Left vertebral artery origin narrowing. Electronically Signed   By: Ulyses Jarred M.D.   On: 09/24/2019 22:04   MR BRAIN WO CONTRAST  Result Date: 09/24/2019 CLINICAL DATA:  Right-sided facial numbness with vision changes. EXAM: MR HEAD WITHOUT CONTRAST MR CIRCLE OF WILLIS WITHOUT CONTRAST MRA OF THE NECK WITHOUT AND WITH CONTRAST TECHNIQUE: Multiplanar, multiecho pulse sequences of the brain, circle of willis and surrounding structures were obtained without intravenous contrast. Angiographic images of the neck were obtained using MRA technique without and with intravenous contrast. CONTRAST:  40mL GADAVIST GADOBUTROL 1 MMOL/ML IV SOLN COMPARISON:  None. FINDINGS: MR HEAD FINDINGS Brain: No acute infarct, acute hemorrhage or extra-axial collection. Multifocal hyperintense T2-weighted signal within the white matter. Normal volume of CSF spaces. Single chronic microhemorrhage from the left cerebellum. Normal midline structures. Vascular: Normal flow voids. Skull and upper cervical spine: Normal marrow signal. Sinuses/Orbits: Negative. Other: None. MR CIRCLE OF WILLIS FINDINGS POSTERIOR CIRCULATION: --Vertebral arteries: Normal V4 segments. --Inferior cerebellar arteries: Normal. --Basilar artery: Normal. --Superior cerebellar arteries: Normal. --Posterior cerebral arteries: Normal. ANTERIOR CIRCULATION: --Intracranial internal carotid arteries: Normal. --Anterior cerebral arteries (ACA): Normal. Both A1 segments are present. Patent anterior communicating artery (  a-comm). --Middle cerebral arteries (MCA): Normal. MRA NECK FINDINGS There is severe stenosis of the proximal right internal carotid artery (series 1096,  image 165). The remainder of the ICA is normal. The left carotid system is normal. Vertebral system is codominant. There stenosis of the left vertebral artery origin. Otherwise the vertebral arteries normal. IMPRESSION: 1. No acute intracranial abnormality. 2. Normal intracranial MRA. 3. 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 4. Left vertebral artery origin narrowing. Electronically Signed   By: Ulyses Jarred M.D.   On: 09/24/2019 22:04   ECHOCARDIOGRAM COMPLETE  Result Date: 09/25/2019    ECHOCARDIOGRAM REPORT   Patient Name:   JOHNMICHAEL MELHORN Date of Exam: 09/25/2019 Medical Rec #:  [id number]     Height:       67.0 in Accession #:    [id number]    Weight:       138.0 lb Date of Birth:  Feb 11, 1955      BSA:          1.727 m Patient Age:    54 years      BP:           121/67 mmHg Patient Gender: M             HR:           72 bpm. Exam Location:  Inpatient Procedure: 2D Echo Indications:    TIA 435.9  History:        Patient has no prior history of Echocardiogram examinations.                 CAD.  Sonographer:    Johny Chess Referring Phys: [id number] [place]  1. Left ventricular ejection fraction, by estimation, is 45 to 50%. The left ventricle has mildly decreased function. The left ventricle demonstrates global hypokinesis. Left ventricular diastolic parameters are consistent with Grade I diastolic dysfunction (impaired relaxation).  2. Right ventricular systolic function is normal. The right ventricular size is normal. There is normal pulmonary artery systolic pressure. The estimated right ventricular systolic pressure is 44.8 mmHg.  3. The mitral valve is normal in structure. Mild mitral valve regurgitation. No evidence of mitral stenosis.  4. The aortic valve is normal in structure. Aortic valve regurgitation is not visualized. No aortic stenosis is present.  5. The inferior vena cava is normal in size with greater than 50% respiratory variability, suggesting right  atrial pressure of 3 mmHg. FINDINGS  Left Ventricle: Left ventricular ejection fraction, by estimation, is 45 to 50%. The left ventricle has mildly decreased function. The left ventricle demonstrates global hypokinesis. The left ventricular internal cavity size was normal in size. There is  no left ventricular hypertrophy. Left ventricular diastolic parameters are consistent with Grade I diastolic dysfunction (impaired relaxation). Normal left ventricular filling pressure. Right Ventricle: The right ventricular size is normal. No increase in right ventricular wall thickness. Right ventricular systolic function is normal. There is normal pulmonary artery systolic pressure. The tricuspid regurgitant velocity is 2.28 m/s, and  with an assumed right atrial pressure of 3 mmHg, the estimated right ventricular systolic pressure is 18.5 mmHg. Left Atrium: Left atrial size was normal in size. Right Atrium: Right atrial size was normal in size. Pericardium: There is no evidence of pericardial effusion. Mitral Valve: The mitral valve is normal in structure. There is mild thickening of the mitral valve leaflet(s). Normal mobility of the mitral valve leaflets. Mild mitral annular calcification. Mild mitral valve regurgitation. No evidence  of mitral valve stenosis. Tricuspid Valve: The tricuspid valve is normal in structure. Tricuspid valve regurgitation is trivial. No evidence of tricuspid stenosis. Aortic Valve: The aortic valve is normal in structure. Aortic valve regurgitation is not visualized. No aortic stenosis is present. Pulmonic Valve: The pulmonic valve was normal in structure. Pulmonic valve regurgitation is not visualized. No evidence of pulmonic stenosis. Aorta: The aortic root is normal in size and structure. Venous: The inferior vena cava is normal in size with greater than 50% respiratory variability, suggesting right atrial pressure of 3 mmHg. IAS/Shunts: No atrial level shunt detected by color flow Doppler.   LEFT VENTRICLE PLAX 2D LVIDd:         5.20 cm  Diastology LVIDs:         4.00 cm  LV e' lateral:   7.51 cm/s LV PW:         0.90 cm  LV E/e' lateral: 7.1 LV IVS:        0.90 cm  LV e' medial:    7.07 cm/s LVOT diam:     2.20 cm  LV E/e' medial:  7.6 LV SV:         55 LV SV Index:   32 LVOT Area:     3.80 cm  RIGHT VENTRICLE             IVC RV S prime:     11.30 cm/s  IVC diam: 1.00 cm TAPSE (M-mode): 2.2 cm LEFT ATRIUM             Index       RIGHT ATRIUM           Index LA diam:        3.80 cm 2.20 cm/m  RA Area:     11.60 cm LA Vol (A2C):   40.2 ml 23.27 ml/m RA Volume:   26.30 ml  15.23 ml/m LA Vol (A4C):   39.2 ml 22.70 ml/m LA Biplane Vol: 41.4 ml 23.97 ml/m  AORTIC VALVE LVOT Vmax:   69.20 cm/s LVOT Vmean:  47.600 cm/s LVOT VTI:    0.144 m  AORTA Ao Root diam: 3.10 cm MITRAL VALVE               TRICUSPID VALVE MV Area (PHT): 3.48 cm    TR Peak grad:   20.8 mmHg MV Decel Time: 218 msec    TR Vmax:        228.00 cm/s MR PISA:        1.01 cm MR PISA Radius: 0.40 cm    SHUNTS MV E velocity: 53.60 cm/s  Systemic VTI:  0.14 m MV A velocity: 83.10 cm/s  Systemic Diam: 2.20 cm MV E/A ratio:  0.65 Fransico Him MD Electronically signed by Fransico Him MD Signature Date/Time: 09/25/2019/10:17:53 AM    Final    CT HEAD CODE STROKE WO CONTRAST  Result Date: 09/24/2019 CLINICAL DATA:  Code stroke. Sudden onset numbness to the right-sided face beginning this morning EXAM: CT HEAD WITHOUT CONTRAST TECHNIQUE: Contiguous axial images were obtained from the base of the skull through the vertex without intravenous contrast. COMPARISON:  None. FINDINGS: Brain: No evidence of acute infarction, hemorrhage, hydrocephalus, or mass lesion/mass effect. Prominent CSF along cerebral convexities. There could be trace subdural hygroma on both sides towards the vertex, but no definite cortical vein mass effect. Vascular: No hyperdense vessel or unexpected calcification. Skull: Normal. Negative for fracture or focal lesion.  Sinuses/Orbits: No acute finding. Other: These results  were called by telephone at the time of interpretation on 09/24/2019 at 12:44 pm to provider [facility] , who verbally acknowledged these results. ASPECTS [facility] Stroke Program Early CT Score) - Ganglionic level infarction (caudate, lentiform nuclei, internal capsule, insula, M1-M3 cortex): 7 - Supraganglionic infarction (M4-M6 cortex): 3 Total score (0-10 with 10 being normal): 10 IMPRESSION: 1. Negative for hemorrhage or visible infarct.ASPECTS is 10. 2. Prominent CSF at the convexities, normal variant versus trace subdural hygromas. Electronically Signed   By: Monte Fantasia M.D.   On: 09/24/2019 12:46        Scheduled Meds: . aspirin  81 mg Oral Daily  . atorvastatin  80 mg Oral Daily  . clopidogrel  75 mg Oral Daily  . enoxaparin (LOVENOX) injection  40 mg Subcutaneous Q24H   Continuous Infusions:   LOS: 0 days     Cordelia Poche, MD [facility] 09/25/2019, 11:17 AM  If 7PM-7AM, please contact night-coverage [email or web address]

## 2019-09-26 LAB — BASIC METABOLIC PANEL
Anion gap: 13 (ref 5–15)
BUN: 18 mg/dL (ref 8–23)
CO2: 22 mmol/L (ref 22–32)
Calcium: 9.4 mg/dL (ref 8.9–10.3)
Chloride: 101 mmol/L (ref 98–111)
Creatinine, Ser: 1.22 mg/dL (ref 0.61–1.24)
GFR calc Af Amer: >60 mL/min (ref >60)
GFR calc non Af Amer: >60 mL/min (ref >60)
Glucose, Bld: 107 mg/dL — ABNORMAL HIGH (ref 70–99)
Potassium: 4.2 mmol/L (ref 3.5–5.1)
Sodium: 136 mmol/L (ref 135–145)

## 2019-09-26 LAB — CBC
HCT: 43 % (ref 39.0–52.0)
Hemoglobin: 14.4 g/dL (ref 13.0–17.0)
MCH: 32 pg (ref 26.0–34.0)
MCHC: 33.5 g/dL (ref 30.0–36.0)
MCV: 95.6 fL (ref 80.0–100.0)
Platelets: 328 10*3/uL (ref 150–400)
RBC: 4.5 MIL/uL (ref 4.22–5.81)
RDW: 13.1 % (ref 11.5–15.5)
WBC: 10.4 10*3/uL (ref 4.0–10.5)
nRBC: 0 % (ref 0.0–0.2)

## 2019-09-26 NOTE — Progress Notes (Signed)
STROKE TEAM PROGRESS NOTE   INTERVAL HISTORY Mom at the bedside. Pt sitting in bed, no complains. No amaurosis episode overnight. 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 [facility] will do TCAR tomorrow.    Vitals:   09/25/19 2011 09/26/19 0110 09/26/19 0536 09/26/19 1214  BP: 139/81 (!) 141/76 (!) 142/86 124/75  Pulse: 72 93 86 (!) 102  Resp: '16 16 16 16  ' Temp: 98.4 F (36.9 C) 98.5 F (36.9 C) (!) 97.5 F (36.4 C) (!) 97.3 F (36.3 C)  TempSrc: Oral Oral Oral Oral  SpO2: 96% 95% 94% 96%  Weight:      Height:       CBC:  Recent Labs  Lab 09/24/19 1258 09/24/19 1258 09/25/19 0706 09/26/19 1135  WBC 13.3*   < > 7.3 10.4  NEUTROABS 10.2*  --   --   --   HGB 13.8   < > 15.7 14.4  HCT 42.2   < > 47.5 43.0  MCV 97.5   < > 96.0 95.6  PLT 317   < > 328 328   < > = values in this interval not displayed.   Basic Metabolic Panel:  Recent Labs  Lab 09/25/19 0706 09/26/19 1135  NA 140 136  K 4.0 4.2  CL 103 101  CO2 26 22  GLUCOSE 87 107*  BUN 14 18  CREATININE 1.20 1.22  CALCIUM 9.5 9.4   Lipid Panel:  Recent Labs  Lab 09/25/19 0706  CHOL 265*  TRIG 146  HDL 65  CHOLHDL 4.1  VLDL 29  LDLCALC 171*   HgbA1c:  Recent Labs  Lab 09/25/19 0706  HGBA1C 5.9*   Urine Drug Screen: No results for input(s): LABOPIA, COCAINSCRNUR, LABBENZ, AMPHETMU, THCU, LABBARB in the last 168 hours.  Alcohol Level  Recent Labs  Lab 09/24/19 1258  ETH <10    IMAGING past 24 hours CT ANGIO HEAD W OR WO CONTRAST  Result Date: 09/25/2019 CLINICAL DATA:  Neuro deficit. EXAM: CT ANGIOGRAPHY HEAD AND NECK TECHNIQUE: Multidetector CT imaging of the head and neck was performed using the standard protocol during bolus administration of intravenous contrast. Multiplanar CT image reconstructions and MIPs were obtained to evaluate the vascular anatomy. Carotid stenosis measurements (when applicable) are obtained utilizing NASCET criteria, using the distal internal carotid diameter as  the denominator. CONTRAST:  40m ISOVUE-370 IOPAMIDOL (ISOVUE-370) INJECTION 76% COMPARISON:  09/24/2019 head CT. 09/24/2019 MRI head, MRA head and MRA neck. FINDINGS: CT HEAD FINDINGS Brain: No acute infarct or intracranial hemorrhage. No mass lesion. No midline shift, ventriculomegaly or extra-axial fluid collection. Cerebral volume within normal limits. Minimal chronic microvascular ischemic changes. Vascular: No hyperdense vessel. Bilateral skull base atherosclerotic calcifications. Skull: Negative for fracture or focal lesion. Sinuses/Orbits: Normal orbits. Small left maxillary sinus mucous retention cyst. No mastoid effusion. Other: None. Review of the MIP images confirms the above findings CTA NECK FINDINGS Aortic arch: Standard branching. Imaged portion shows no evidence of aneurysm or dissection. Mild calcified atheromatous plaque involving the aortic arch and great vessel origins. No significant stenosis of the major arch vessel origins. Right carotid system: Carotid bifurcation atherosclerotic calcifications with approximately 80-90% narrowing of the carotid bulb and approximately 60-70% narrowing of the proximal right ICA. No evidence of dissection or aneurysm. Left carotid system: Noncalcified atheromatous plaque along the proximal left common carotid resulting in approximately 40% luminal narrowing. Multifocal plaque ulceration is also seen along the proximal left common carotid. Carotid bifurcation atherosclerotic calcifications with approximately 30%  luminal narrowing the carotid bulb and proximal ICA. No evidence of dissection or aneurysm. Vertebral arteries: Codominant. No evidence of dissection or aneurysm. Left vertebral artery origin atherosclerotic calcifications with approximately 30% luminal narrowing. No high-grade narrowing involving the right V1 segment. Skeleton: Multilevel spondylosis. Other neck: No adenopathy.  No soft tissue mass. Upper chest: Emphysema. 6 mm right apical ground-glass  nodule is unchanged. Partially imaged right upper lung reticular opacities. Review of the MIP images confirms the above findings CTA HEAD FINDINGS Anterior circulation: No high-grade stenosis, proximal occlusion, aneurysm, or vascular malformation. Right A1 segment hypoplasia. Bilateral carotid siphon atherosclerotic calcifications with mild narrowing of the intracavernous segments. Posterior circulation: No significant stenosis, proximal occlusion, aneurysm, or vascular malformation. Fetal origin of the right PCA. Venous sinuses: Patent. Anatomic variants: As detailed above. Review of the MIP images confirms the above findings IMPRESSION: 80-90% narrowing of the right carotid bulb and 60-70% narrowing of the proximal right ICA secondary to calcified atheromatous plaque. Proximal left common carotid atheromatous plaque with approximately 40% luminal narrowing. Multifocal plaque ulcerations are also seen along the proximal left common carotid artery. 30% luminal narrowing of the left carotid bulb and proximal left ICA. Left vertebral artery origin atherosclerotic calcifications with approximately 30% luminal narrowing. Bilateral carotid siphon atherosclerotic calcifications with mild bilateral intracavernous ICA narrowing. Electronically Signed   By: Primitivo Gauze M.D.   On: 09/25/2019 14:42   CT ANGIO NECK W OR WO CONTRAST  Result Date: 09/25/2019 CLINICAL DATA:  Neuro deficit. EXAM: CT ANGIOGRAPHY HEAD AND NECK TECHNIQUE: Multidetector CT imaging of the head and neck was performed using the standard protocol during bolus administration of intravenous contrast. Multiplanar CT image reconstructions and MIPs were obtained to evaluate the vascular anatomy. Carotid stenosis measurements (when applicable) are obtained utilizing NASCET criteria, using the distal internal carotid diameter as the denominator. CONTRAST:  69m ISOVUE-370 IOPAMIDOL (ISOVUE-370) INJECTION 76% COMPARISON:  09/24/2019 head CT. 09/24/2019  MRI head, MRA head and MRA neck. FINDINGS: CT HEAD FINDINGS Brain: No acute infarct or intracranial hemorrhage. No mass lesion. No midline shift, ventriculomegaly or extra-axial fluid collection. Cerebral volume within normal limits. Minimal chronic microvascular ischemic changes. Vascular: No hyperdense vessel. Bilateral skull base atherosclerotic calcifications. Skull: Negative for fracture or focal lesion. Sinuses/Orbits: Normal orbits. Small left maxillary sinus mucous retention cyst. No mastoid effusion. Other: None. Review of the MIP images confirms the above findings CTA NECK FINDINGS Aortic arch: Standard branching. Imaged portion shows no evidence of aneurysm or dissection. Mild calcified atheromatous plaque involving the aortic arch and great vessel origins. No significant stenosis of the major arch vessel origins. Right carotid system: Carotid bifurcation atherosclerotic calcifications with approximately 80-90% narrowing of the carotid bulb and approximately 60-70% narrowing of the proximal right ICA. No evidence of dissection or aneurysm. Left carotid system: Noncalcified atheromatous plaque along the proximal left common carotid resulting in approximately 40% luminal narrowing. Multifocal plaque ulceration is also seen along the proximal left common carotid. Carotid bifurcation atherosclerotic calcifications with approximately 30% luminal narrowing the carotid bulb and proximal ICA. No evidence of dissection or aneurysm. Vertebral arteries: Codominant. No evidence of dissection or aneurysm. Left vertebral artery origin atherosclerotic calcifications with approximately 30% luminal narrowing. No high-grade narrowing involving the right V1 segment. Skeleton: Multilevel spondylosis. Other neck: No adenopathy.  No soft tissue mass. Upper chest: Emphysema. 6 mm right apical ground-glass nodule is unchanged. Partially imaged right upper lung reticular opacities. Review of the MIP images confirms the above  findings CTA HEAD FINDINGS Anterior  circulation: No high-grade stenosis, proximal occlusion, aneurysm, or vascular malformation. Right A1 segment hypoplasia. Bilateral carotid siphon atherosclerotic calcifications with mild narrowing of the intracavernous segments. Posterior circulation: No significant stenosis, proximal occlusion, aneurysm, or vascular malformation. Fetal origin of the right PCA. Venous sinuses: Patent. Anatomic variants: As detailed above. Review of the MIP images confirms the above findings IMPRESSION: 80-90% narrowing of the right carotid bulb and 60-70% narrowing of the proximal right ICA secondary to calcified atheromatous plaque. Proximal left common carotid atheromatous plaque with approximately 40% luminal narrowing. Multifocal plaque ulcerations are also seen along the proximal left common carotid artery. 30% luminal narrowing of the left carotid bulb and proximal left ICA. Left vertebral artery origin atherosclerotic calcifications with approximately 30% luminal narrowing. Bilateral carotid siphon atherosclerotic calcifications with mild bilateral intracavernous ICA narrowing. Electronically Signed   By: Primitivo Gauze M.D.   On: 09/25/2019 14:42   MR ANGIO HEAD WO CONTRAST  Result Date: 09/24/2019 CLINICAL DATA:  Right-sided facial numbness with vision changes. EXAM: MR HEAD WITHOUT CONTRAST MR CIRCLE OF WILLIS WITHOUT CONTRAST MRA OF THE NECK WITHOUT AND WITH CONTRAST TECHNIQUE: Multiplanar, multiecho pulse sequences of the brain, circle of willis and surrounding structures were obtained without intravenous contrast. Angiographic images of the neck were obtained using MRA technique without and with intravenous contrast. CONTRAST:  40m GADAVIST GADOBUTROL 1 MMOL/ML IV SOLN COMPARISON:  None. FINDINGS: MR HEAD FINDINGS Brain: No acute infarct, acute hemorrhage or extra-axial collection. Multifocal hyperintense T2-weighted signal within the white matter. Normal volume of CSF  spaces. Single chronic microhemorrhage from the left cerebellum. Normal midline structures. Vascular: Normal flow voids. Skull and upper cervical spine: Normal marrow signal. Sinuses/Orbits: Negative. Other: None. MR CIRCLE OF WILLIS FINDINGS POSTERIOR CIRCULATION: --Vertebral arteries: Normal V4 segments. --Inferior cerebellar arteries: Normal. --Basilar artery: Normal. --Superior cerebellar arteries: Normal. --Posterior cerebral arteries: Normal. ANTERIOR CIRCULATION: --Intracranial internal carotid arteries: Normal. --Anterior cerebral arteries (ACA): Normal. Both A1 segments are present. Patent anterior communicating artery (a-comm). --Middle cerebral arteries (MCA): Normal. MRA NECK FINDINGS There is severe stenosis of the proximal right internal carotid artery (series 1096, image 165). The remainder of the ICA is normal. The left carotid system is normal. Vertebral system is codominant. There stenosis of the left vertebral artery origin. Otherwise the vertebral arteries normal. IMPRESSION: 1. No acute intracranial abnormality. 2. Normal intracranial MRA. 3. 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 4. Left vertebral artery origin narrowing. Electronically Signed   By: KUlyses JarredM.D.   On: 09/24/2019 22:04   MR ANGIO NECK W WO CONTRAST  Result Date: 09/24/2019 CLINICAL DATA:  Right-sided facial numbness with vision changes. EXAM: MR HEAD WITHOUT CONTRAST MR CIRCLE OF WILLIS WITHOUT CONTRAST MRA OF THE NECK WITHOUT AND WITH CONTRAST TECHNIQUE: Multiplanar, multiecho pulse sequences of the brain, circle of willis and surrounding structures were obtained without intravenous contrast. Angiographic images of the neck were obtained using MRA technique without and with intravenous contrast. CONTRAST:  656mGADAVIST GADOBUTROL 1 MMOL/ML IV SOLN COMPARISON:  None. FINDINGS: MR HEAD FINDINGS Brain: No acute infarct, acute hemorrhage or extra-axial collection. Multifocal hyperintense T2-weighted  signal within the white matter. Normal volume of CSF spaces. Single chronic microhemorrhage from the left cerebellum. Normal midline structures. Vascular: Normal flow voids. Skull and upper cervical spine: Normal marrow signal. Sinuses/Orbits: Negative. Other: None. MR CIRCLE OF WILLIS FINDINGS POSTERIOR CIRCULATION: --Vertebral arteries: Normal V4 segments. --Inferior cerebellar arteries: Normal. --Basilar artery: Normal. --Superior cerebellar arteries: Normal. --Posterior cerebral arteries: Normal. ANTERIOR  CIRCULATION: --Intracranial internal carotid arteries: Normal. --Anterior cerebral arteries (ACA): Normal. Both A1 segments are present. Patent anterior communicating artery (a-comm). --Middle cerebral arteries (MCA): Normal. MRA NECK FINDINGS There is severe stenosis of the proximal right internal carotid artery (series 1096, image 165). The remainder of the ICA is normal. The left carotid system is normal. Vertebral system is codominant. There stenosis of the left vertebral artery origin. Otherwise the vertebral arteries normal. IMPRESSION: 1. No acute intracranial abnormality. 2. Normal intracranial MRA. 3. 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 4. Left vertebral artery origin narrowing. Electronically Signed   By: Ulyses Jarred M.D.   On: 09/24/2019 22:04   MR BRAIN WO CONTRAST  Result Date: 09/24/2019 CLINICAL DATA:  Right-sided facial numbness with vision changes. EXAM: MR HEAD WITHOUT CONTRAST MR CIRCLE OF WILLIS WITHOUT CONTRAST MRA OF THE NECK WITHOUT AND WITH CONTRAST TECHNIQUE: Multiplanar, multiecho pulse sequences of the brain, circle of willis and surrounding structures were obtained without intravenous contrast. Angiographic images of the neck were obtained using MRA technique without and with intravenous contrast. CONTRAST:  57m GADAVIST GADOBUTROL 1 MMOL/ML IV SOLN COMPARISON:  None. FINDINGS: MR HEAD FINDINGS Brain: No acute infarct, acute hemorrhage or extra-axial  collection. Multifocal hyperintense T2-weighted signal within the white matter. Normal volume of CSF spaces. Single chronic microhemorrhage from the left cerebellum. Normal midline structures. Vascular: Normal flow voids. Skull and upper cervical spine: Normal marrow signal. Sinuses/Orbits: Negative. Other: None. MR CIRCLE OF WILLIS FINDINGS POSTERIOR CIRCULATION: --Vertebral arteries: Normal V4 segments. --Inferior cerebellar arteries: Normal. --Basilar artery: Normal. --Superior cerebellar arteries: Normal. --Posterior cerebral arteries: Normal. ANTERIOR CIRCULATION: --Intracranial internal carotid arteries: Normal. --Anterior cerebral arteries (ACA): Normal. Both A1 segments are present. Patent anterior communicating artery (a-comm). --Middle cerebral arteries (MCA): Normal. MRA NECK FINDINGS There is severe stenosis of the proximal right internal carotid artery (series 1096, image 165). The remainder of the ICA is normal. The left carotid system is normal. Vertebral system is codominant. There stenosis of the left vertebral artery origin. Otherwise the vertebral arteries normal. IMPRESSION: 1. No acute intracranial abnormality. 2. Normal intracranial MRA. 3. 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 4. Left vertebral artery origin narrowing. Electronically Signed   By: KUlyses JarredM.D.   On: 09/24/2019 22:04   ECHOCARDIOGRAM COMPLETE  Result Date: 09/25/2019    ECHOCARDIOGRAM REPORT   Patient Name:   Peter ARGABRIGHTDate of Exam: 09/25/2019 Medical Rec #:  [id number]    Height:       67.0 in Accession #:    [id number]   Weight:       138.0 lb Date of Birth:  [birth year]     BSA:          1.727 m Patient Age:    89years      BP:           121/67 mmHg Patient Gender: M             HR:           72 bpm. Exam Location:  Inpatient Procedure: 2D Echo Indications:    TIA 435.9  History:        Patient has no prior history of Echocardiogram examinations.                 CAD.  Sonographer:     LJohny ChessReferring Phys: [id number] 1. Left ventricular ejection fraction, by estimation, is  45 to 50%. The left ventricle has mildly decreased function. The left ventricle demonstrates global hypokinesis. Left ventricular diastolic parameters are consistent with Grade I diastolic dysfunction (impaired relaxation).  2. Right ventricular systolic function is normal. The right ventricular size is normal. There is normal pulmonary artery systolic pressure. The estimated right ventricular systolic pressure is 11.5 mmHg.  3. The mitral valve is normal in structure. Mild mitral valve regurgitation. No evidence of mitral stenosis.  4. The aortic valve is normal in structure. Aortic valve regurgitation is not visualized. No aortic stenosis is present.  5. The inferior vena cava is normal in size with greater than 50% respiratory variability, suggesting right atrial pressure of 3 mmHg. FINDINGS  Left Ventricle: Left ventricular ejection fraction, by estimation, is 45 to 50%. The left ventricle has mildly decreased function. The left ventricle demonstrates global hypokinesis. The left ventricular internal cavity size was normal in size. There is  no left ventricular hypertrophy. Left ventricular diastolic parameters are consistent with Grade I diastolic dysfunction (impaired relaxation). Normal left ventricular filling pressure. Right Ventricle: The right ventricular size is normal. No increase in right ventricular wall thickness. Right ventricular systolic function is normal. There is normal pulmonary artery systolic pressure. The tricuspid regurgitant velocity is 2.28 m/s, and  with an assumed right atrial pressure of 3 mmHg, the estimated right ventricular systolic pressure is 72.6 mmHg. Left Atrium: Left atrial size was normal in size. Right Atrium: Right atrial size was normal in size. Pericardium: There is no evidence of pericardial effusion. Mitral Valve: The mitral valve is normal in  structure. There is mild thickening of the mitral valve leaflet(s). Normal mobility of the mitral valve leaflets. Mild mitral annular calcification. Mild mitral valve regurgitation. No evidence of mitral valve stenosis. Tricuspid Valve: The tricuspid valve is normal in structure. Tricuspid valve regurgitation is trivial. No evidence of tricuspid stenosis. Aortic Valve: The aortic valve is normal in structure. Aortic valve regurgitation is not visualized. No aortic stenosis is present. Pulmonic Valve: The pulmonic valve was normal in structure. Pulmonic valve regurgitation is not visualized. No evidence of pulmonic stenosis. Aorta: The aortic root is normal in size and structure. Venous: The inferior vena cava is normal in size with greater than 50% respiratory variability, suggesting right atrial pressure of 3 mmHg. IAS/Shunts: No atrial level shunt detected by color flow Doppler.  LEFT VENTRICLE PLAX 2D LVIDd:         5.20 cm  Diastology LVIDs:         4.00 cm  LV e' lateral:   7.51 cm/s LV PW:         0.90 cm  LV E/e' lateral: 7.1 LV IVS:        0.90 cm  LV e' medial:    7.07 cm/s LVOT diam:     2.20 cm  LV E/e' medial:  7.6 LV SV:         55 LV SV Index:   32 LVOT Area:     3.80 cm  RIGHT VENTRICLE             IVC RV S prime:     11.30 cm/s  IVC diam: 1.00 cm TAPSE (M-mode): 2.2 cm LEFT ATRIUM             Index       RIGHT ATRIUM           Index LA diam:        3.80 cm 2.20 cm/m  RA  Area:     11.60 cm LA Vol (A2C):   40.2 ml 23.27 ml/m RA Volume:   26.30 ml  15.23 ml/m LA Vol (A4C):   39.2 ml 22.70 ml/m LA Biplane Vol: 41.4 ml 23.97 ml/m  AORTIC VALVE LVOT Vmax:   69.20 cm/s LVOT Vmean:  47.600 cm/s LVOT VTI:    0.144 m  AORTA Ao Root diam: 3.10 cm MITRAL VALVE               TRICUSPID VALVE MV Area (PHT): 3.48 cm    TR Peak grad:   20.8 mmHg MV Decel Time: 218 msec    TR Vmax:        228.00 cm/s MR PISA:        1.01 cm MR PISA Radius: 0.40 cm    SHUNTS MV E velocity: 53.60 cm/s  Systemic VTI:  0.14 m MV  A velocity: 83.10 cm/s  Systemic Diam: 2.20 cm MV E/A ratio:  0.65 Fransico Him MD Electronically signed by Fransico Him MD Signature Date/Time: 09/25/2019/10:17:53 AM    Final    CT HEAD CODE STROKE WO CONTRAST  Result Date: 09/24/2019 CLINICAL DATA:  Code stroke. Sudden onset numbness to the right-sided face beginning this morning EXAM: CT HEAD WITHOUT CONTRAST TECHNIQUE: Contiguous axial images were obtained from the base of the skull through the vertex without intravenous contrast. COMPARISON:  None. FINDINGS: Brain: No evidence of acute infarction, hemorrhage, hydrocephalus, or mass lesion/mass effect. Prominent CSF along cerebral convexities. There could be trace subdural hygroma on both sides towards the vertex, but no definite cortical vein mass effect. Vascular: No hyperdense vessel or unexpected calcification. Skull: Normal. Negative for fracture or focal lesion. Sinuses/Orbits: No acute finding. Other: These results were called by telephone at the time of interpretation on 09/24/2019 at 12:44 pm to provider [facility] , who verbally acknowledged these results. ASPECTS [facility] Stroke Program Early CT Score) - Ganglionic level infarction (caudate, lentiform nuclei, internal capsule, insula, M1-M3 cortex): 7 - Supraganglionic infarction (M4-M6 cortex): 3 Total score (0-10 with 10 being normal): 10 IMPRESSION: 1. Negative for hemorrhage or visible infarct.ASPECTS is 10. 2. Prominent CSF at the convexities, normal variant versus trace subdural hygromas. Electronically Signed   By: Monte Fantasia M.D.   On: 09/24/2019 12:46   VAS US CAROTID  Result Date: 09/25/2019 Carotid Arterial Duplex Study Indications: Visual disturbance and stenosis on MRA. Limitations  Today's exam was limited due to the high bifurcation of the              carotid, heavy calcification and the resulting shadowing and              previous neck surgeries and scarring. Performing Technologist: Antonieta Pert RDMS, RVT  Examination  Guidelines: A complete evaluation includes B-mode imaging, spectral Doppler, color Doppler, and power Doppler as needed of all accessible portions of each vessel. Bilateral testing is considered an integral part of a complete examination. Limited examinations for reoccurring indications may be performed as noted.  Right Carotid Findings: +----------+--------+--------+--------+--------------------+------------------+           PSV cm/sEDV cm/sStenosisPlaque Description  Comments           +----------+--------+--------+--------+--------------------+------------------+ CCA Prox  72      19                                                     +----------+--------+--------+--------+--------------------+------------------+  CCA Mid                                               intimal thickening +----------+--------+--------+--------+--------------------+------------------+ CCA Distal106     29                                                     +----------+--------+--------+--------+--------------------+------------------+ ICA Prox  279     105     60-79%  diffuse and calcific                   +----------+--------+--------+--------+--------------------+------------------+ ICA Mid   125     36                                                     +----------+--------+--------+--------+--------------------+------------------+ ICA Distal66      27                                                     +----------+--------+--------+--------+--------------------+------------------+ ECA       209     48                                                     +----------+--------+--------+--------+--------------------+------------------+ +----------+--------+-------+------------+-------------------+           PSV cm/sEDV cmsDescribe    Arm Pressure (mmHG) +----------+--------+-------+------------+-------------------+ Subclavian               Not assessed                     +----------+--------+-------+------------+-------------------+ +---------+--------+--+--------+--+---------+ VertebralPSV cm/s38EDV cm/s12Antegrade +---------+--------+--+--------+--+---------+ heavy calcification could underestimate severity of stenosis Left Carotid Findings: +----------+--------+--------+--------+---------------------+------------------+           PSV cm/sEDV cm/sStenosisPlaque Description   Comments           +----------+--------+--------+--------+---------------------+------------------+ CCA Prox  114     39                                                      +----------+--------+--------+--------+---------------------+------------------+ CCA Mid   167     46                                   intimal thickening +----------+--------+--------+--------+---------------------+------------------+ CCA Distal179     57                                                      +----------+--------+--------+--------+---------------------+------------------+  ICA Prox  135     47      40-59%  diffuse, heterogenous                                                     and calcific                            +----------+--------+--------+--------+---------------------+------------------+ ICA Mid   154     52      40-59%                                          +----------+--------+--------+--------+---------------------+------------------+ ICA Distal110     36                                                      +----------+--------+--------+--------+---------------------+------------------+ ECA       121     27                                                      +----------+--------+--------+--------+---------------------+------------------+ +----------+--------+--------+--------+-------------------+           PSV cm/sEDV cm/sDescribeArm Pressure (mmHG) +----------+--------+--------+--------+-------------------+ Subclavian122                                          +----------+--------+--------+--------+-------------------+ +---------+--------+--+--------+--+---------+ VertebralPSV cm/s65EDV cm/s24Antegrade +---------+--------+--+--------+--+---------+   Summary: Right Carotid: Velocities in the right ICA are consistent with a 60-79%                stenosis. Difficult examination bilaterally due to plaque and                scaring. Heavy calcification near prox ICA could underestimate                severity of stenosis. Left Carotid: Velocities in the left ICA are consistent with a 40-59% stenosis. Vertebrals: Bilateral vertebral arteries demonstrate antegrade flow. *See table(s) above for measurements and observations.  Electronically signed by Deitra Mayo MD on 09/25/2019 at 4:07:38 PM.    Final      PHYSICAL EXAM  Temp:  [97.3 F (36.3 C)-98.5 F (36.9 C)] 97.3 F (36.3 C) (09/08 1214) Pulse Rate:  [72-102] 102 (09/08 1214) Resp:  [16-17] 16 (09/08 1214) BP: (124-142)/(75-86) 124/75 (09/08 1214) SpO2:  [94 %-97 %] 96 % (09/08 1214)  General - Well nourished, well developed, in no apparent distress.  Ophthalmologic - fundi not visualized due to noncooperation.  Cardiovascular - Regular rhythm and rate.  Mental Status -  Level of arousal and orientation to time, place, and person were intact. Language including expression, naming, repetition, comprehension was assessed and found intact. Fund of Knowledge was assessed and was intact.  Cranial Nerves II - XII - II - Visual field intact OU. III,  IV, VI - Extraocular movements intact. V - Chronic right lower facial decreased sensation due to previous surgery and radiation VII - Facial movement intact bilaterally. VIII - Hearing & vestibular intact bilaterally. X - Palate elevates symmetrically. XI - Chin turning & shoulder shrug intact bilaterally. XII - Tongue protrusion with difficult for full range protrusion due to previous tongue  surgery.  Motor Strength - The patient's strength was normal in all extremities and pronator drift was absent.  Bulk was normal and fasciculations were absent.   Motor Tone - Muscle tone was assessed at the neck and appendages and was normal.  Reflexes - The patient's reflexes were symmetrical in all extremities and he had no pathological reflexes.  Sensory - Light touch, temperature/pinprick were assessed and were symmetrical.    Coordination - The patient had normal movements in the hands with no ataxia or dysmetria.  Tremor was absent.  Gait and Station - deferred.   ASSESSMENT/PLAN Peter Jordan is a 64 y.o. 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 ASPECTS 10.     MRI  No acute abnormality  MRA head Unremarkable   MRA neck severe proximal R ICA stenosis. L VA origin narrowing    Carotid Doppler  R ICA 60-79% stenosis (may be higher d/t scaring, plaque, calcifications), L ICA 40-59%  CTA head and neck tandem right ICA stenosis with 80-90% at bulb and 60-70% at proximal ICA. Left proximal CCA 40% and left ICA 30% stenosis  2D Echo EF 45-50%. No source of embolus   LDL 171  HgbA1c 5.9  ESR 30  VTE prophylaxis - Lovenox 40 mg sq daily   No antithrombotic prior to admission, now on aspirin 81 mg daily and clopidogrel 75 mg daily. Further regimen per [facility].   Therapy recommendations:  Pending   Disposition:  pending   Symptomatic R Carotid Stenosis  Hx neck  radiation w/ tongue cancer 2013  Recurrent OD amaurosis fugax   MRA neck severe proximal R ICA stenosis.    Carotid Doppler  R ICA 60-79% stenosis (may be higher d/t scaring, plaque, calcifications), L ICA 40-59%  CTA head and neck right ICA stenosis with 80-90% at bulb and 60-70% at proximal ICA. Left proximal CCA 40% and left ICA 30% stenosis  [facility] consulted - Dr. 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 Please call with questions. Pt will follow up with stroke clinic NP at [facility] in about 4 weeks. Thanks for the consult.   Rosalin Hawking, MD PhD Stroke Neurology 09/26/2019 2:09 PM    To contact Stroke Continuity provider, please refer to [email or web address] After hours, contact General Neurology

## 2019-09-26 NOTE — Anesthesia Preprocedure Evaluation (Addendum)
Anesthesia Evaluation  Patient identified by MRN, date of birth, ID band Patient awake    Reviewed: Allergy & Precautions, NPO status , Patient's Chart, lab work & pertinent test results  Airway Mallampati: II  TM Distance: >3 FB     Dental   Pulmonary    breath sounds clear to auscultation       Cardiovascular negative cardio ROS   Rhythm:Regular Rate:Normal     Neuro/Psych    GI/Hepatic negative GI ROS,   Endo/Other    Renal/GU Renal disease     Musculoskeletal   Abdominal   Peds  Hematology   Anesthesia Other Findings   Reproductive/Obstetrics                            Anesthesia Physical Anesthesia Plan  ASA: III  Anesthesia Plan: General   Post-op Pain Management:    Induction: Intravenous  PONV Risk Score and Plan: 2 and Ondansetron and Dexamethasone  Airway Management Planned: Oral ETT  Additional Equipment:   Intra-op Plan:   Post-operative Plan: Possible Post-op intubation/ventilation  Informed Consent:     Dental advisory given  Plan Discussed with: CRNA and Anesthesiologist  Anesthesia Plan Comments:        Anesthesia Quick Evaluation

## 2019-09-26 NOTE — Progress Notes (Addendum)
° °  VASCULAR SURGERY ASSESSMENT & PLAN:   SYMPTOMATIC RIGHT CAROTID STENOSIS: I have reviewed his CT angiogram that was done yesterday.  He has an adequate length of common carotid artery for transcarotid stenting.  However he has almost circumferential calcified plaque at the carotid bifurcation and for this reason may not be a candidate for TCAR.  I will review the films today with my partners.  If he is not a candidate for transcarotid stenting and I think the best option would be maximal medical therapy which she is now on including aspirin, Plavix, and a statin.  When these events occurred he was not on any of these medications.  ADDENDUM: I have reviewed her CT angiogram with Dr. Donzetta Matters.  We feel that the patient is a reasonable candidate for transcarotid stenting and we will try to schedule this for tomorrow.  He is on aspirin, Plavix, and a statin.   SUBJECTIVE:   No specific complaints this morning.  No new issues overnight. ROS: No CP, SOB  PHYSICAL EXAM:   Vitals:   09/25/19 1758 09/25/19 2011 09/26/19 0110 09/26/19 0536  BP: 124/76 139/81 (!) 141/76 (!) 142/86  Pulse: 76 72 93 86  Resp: 17 16 16 16   Temp: 97.9 F (36.6 C) 98.4 F (36.9 C) 98.5 F (36.9 C) (!) 97.5 F (36.4 C)  TempSrc: Oral Oral Oral Oral  SpO2: 97% 96% 95% 94%  Weight:      Height:       Good strength in the upper extremities and lower extremities. Lungs clear  LABS:   CT ANGIOGRAM HEAD AND NECK: I have reviewed the images of the CT angiogram of the head and neck.  He has an extensive calcified plaque at the right carotid bifurcation that is almost circumferential.  Radiology noted a greater than 80% stenosis to the right carotid bulb.  There was a 40% left common carotid artery stenosis.  He was also noted to be bilateral carotid siphon atherosclerotic calcifications with mild bilateral intracavernous ICA narrowing.  CAROTID DUPLEX: I have reviewed his carotid duplex scan from yesterday.  This showed a  60 to 79% right carotid stenosis with a 40 to 59% left carotid stenosis.  Both vertebral arteries were patent with antegrade flow.  ECHO: I reviewed his echo from yesterday.  This shows mildly reduced LV function with an ejection fraction of 45 to 50%.   PROBLEM LIST:    Principal Problem:   Amaurosis fugax Active Problems:   H/O tongue cancer   Mild renal insufficiency   Hyperlipidemia   Prediabetes   Internal carotid artery stenosis, right   CURRENT MEDS:    aspirin  81 mg Oral Daily   atorvastatin  80 mg Oral Daily   clopidogrel  75 mg Oral Daily   enoxaparin (LOVENOX) injection  40 mg Subcutaneous Q24H    Deitra Mayo Office: [phone] 09/26/2019

## 2019-09-26 NOTE — Progress Notes (Signed)
Progress Note    Peter Jordan  XBJ:[id number] DOB: March 09, 1955  DOA: 09/24/2019 PCP: Patient, No Pcp Per    Brief Narrative:     Medical records reviewed and are as summarized below:  Peter Jordan is an 64 y.o. male with a history of tongue cancer status post radiation, CAD status post stent placement.  Patient presented secondary to transient right eye vision loss.MRA and CUS showed right carotid high grade stenosis.  Plan for surgery in the AM.  Assessment/Plan:   Principal Problem:   Amaurosis fugax Active Problems:   H/O tongue cancer   Mild renal insufficiency   Hyperlipidemia   Prediabetes   Internal carotid artery stenosis, right   Amaurosis fugax Neurology consulted on admission.  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  No evidence of stroke on MRI brain.  LDL of 171.  Hemoglobin A1c of 5.9%.  Patient started on aspirin  and Plavix 75 mg in addition to atorvastatin 80 mg daily. -Vascular surgery consult: CT angiogram reviewed by Dr. Scot Dock with Dr. Donzetta Matters.  They feel that the patient is a reasonable candidate for transcarotid stenting. They will try to schedule this for tomorrow.  -Neurology recommendations pending today -Continue aspirin 81 mg and Plavix 75 mg daily -PT/OT d/c'd by neurology Dr. Curly Shores  Hyperlipidemia -LDL : 171 -Continue Lipitor 80 mg daily  Renal insufficiency Mild and unknown baseline.  -Cr stable as of yesterday -no AM labs today  Prediabetes Patient's hemoglobin A1c is 5.9%.   -outpatient follow up  History of tongue cancer  S/p radiation therapy.    Family Communication/Anticipated D/C date and plan/Code Status   DVT prophylaxis: Lovenox ordered. Code Status: Full Code.  Family Communication: wife at bedside Disposition Plan: Status is: Inpatient  Remains inpatient appropriate because:Inpatient level of care appropriate due to severity of illness   Dispo: The patient is from: Home               Anticipated d/c is to: Home              Anticipated d/c date is: 3 days              Patient currently is not medically stable to d/c.         Medical Consultants:    Neurology  Vascular     Subjective:   No current complaints, understands he discussed with Dr. Scot Dock earlier  Objective:    Vitals:   09/25/19 1758 09/25/19 2011 09/26/19 0110 09/26/19 0536  BP: 124/76 139/81 (!) 141/76 (!) 142/86  Pulse: 76 72 93 86  Resp: 17 16 16 16   Temp: 97.9 F (36.6 C) 98.4 F (36.9 C) 98.5 F (36.9 C) (!) 97.5 F (36.4 C)  TempSrc: Oral Oral Oral Oral  SpO2: 97% 96% 95% 94%  Weight:      Height:       No intake or output data in the 24 hours ending 09/26/19 1126 Filed Weights   09/24/19 1225  Weight: 62.6 kg    Exam:  General: Appearance:    Well developed, well nourished male in no acute distress     Lungs:     Clear to auscultation bilaterally, respirations unlabored  Heart:    Normal heart rate. Normal rhythm. No murmurs, rubs, or gallops.   MS:   All extremities are intact.   Neurologic:   Awake, alert, oriented x 3. No apparent focal neurological  defect.     Data Reviewed:   I have personally reviewed following labs and imaging studies:  Labs: Labs show the following:   Basic Metabolic Panel: Recent Labs  Lab 09/24/19 1258 09/25/19 0706  NA 139 140  K 4.0 4.0  CL 104 103  CO2 24 26  GLUCOSE 85 87  BUN 22 14  CREATININE 1.39* 1.20  CALCIUM 9.0 9.5   GFR Estimated Creatinine Clearance: 55.1 mL/min (by C-G formula based on SCr of 1.2 mg/dL). Liver Function Tests: Recent Labs  Lab 09/24/19 1258  AST 17  ALT 14  ALKPHOS 70  BILITOT 0.4  PROT 7.2  ALBUMIN 3.6   No results for input(s): LIPASE, AMYLASE in the last 168 hours. No results for input(s): AMMONIA in the last 168 hours. Coagulation profile Recent Labs  Lab 09/24/19 1258  INR 1.0    CBC: Recent Labs  Lab 09/24/19 1258 09/25/19 0706  WBC 13.3*  7.3  NEUTROABS 10.2*  --   HGB 13.8 15.7  HCT 42.2 47.5  MCV 97.5 96.0  PLT 317 328   Cardiac Enzymes: No results for input(s): CKTOTAL, CKMB, CKMBINDEX, TROPONINI in the last 168 hours. BNP (last 3 results) No results for input(s): PROBNP in the last 8760 hours. CBG: Recent Labs  Lab 09/24/19 1229  GLUCAP 116*   D-Dimer: No results for input(s): DDIMER in the last 72 hours. Hgb A1c: Recent Labs    09/25/19 0706  HGBA1C 5.9*   Lipid Profile: Recent Labs    09/25/19 0706  CHOL 265*  HDL 65  LDLCALC 171*  TRIG 146  CHOLHDL 4.1   Thyroid function studies: No results for input(s): TSH, T4TOTAL, T3FREE, THYROIDAB in the last 72 hours.  Invalid input(s): FREET3 Anemia work up: No results for input(s): VITAMINB12, FOLATE, FERRITIN, TIBC, IRON, RETICCTPCT in the last 72 hours. Sepsis Labs: Recent Labs  Lab 09/24/19 1258 09/25/19 0706  WBC 13.3* 7.3    Microbiology Recent Results (from the past 240 hour(s))  SARS Coronavirus 2 by RT PCR (hospital order, performed in [facility] hospital lab) Nasopharyngeal Nasopharyngeal Swab     Status: None   Collection Time: 09/24/19 12:58 PM   Specimen: Nasopharyngeal Swab  Result Value Ref Range Status   SARS Coronavirus 2 NEGATIVE NEGATIVE Final    Comment: (NOTE) SARS-CoV-2 target nucleic acids are NOT DETECTED.  The SARS-CoV-2 RNA is generally detectable in upper and lower respiratory specimens during the acute phase of infection. The lowest concentration of SARS-CoV-2 viral copies this assay can detect is 250 copies / mL. A negative result does not preclude SARS-CoV-2 infection and should not be used as the sole basis for treatment or other patient management decisions.  A negative result may occur with improper specimen collection / handling, submission of specimen other than nasopharyngeal swab, presence of viral mutation(s) within the areas targeted by this assay, and inadequate number of viral copies (<250  copies / mL). A negative result must be combined with clinical observations, patient history, and epidemiological information.  Fact Sheet for Patients:   StrictlyIdeas.no  Fact Sheet for Healthcare Providers: BankingDealers.co.za  This test is not yet approved or  cleared by the [place] FDA and has been authorized for detection and/or diagnosis of SARS-CoV-2 by FDA under an Emergency Use Authorization (EUA).  This EUA will remain in effect (meaning this test can be used) for the duration of the COVID-19 declaration under Section 564(b)(1) of the Act, 21 U.S.C. section 360bbb-3(b)(1), unless the authorization  is terminated or revoked sooner.  Performed at [facility], [address] [place], [place] [address]     Procedures and diagnostic studies:  CT ANGIO HEAD W OR WO CONTRAST  Result Date: 09/25/2019 CLINICAL DATA:  Neuro deficit. EXAM: CT ANGIOGRAPHY HEAD AND NECK TECHNIQUE: Multidetector CT imaging of the head and neck was performed using the standard protocol during bolus administration of intravenous contrast. Multiplanar CT image reconstructions and MIPs were obtained to evaluate the vascular anatomy. Carotid stenosis measurements (when applicable) are obtained utilizing NASCET criteria, using the distal internal carotid diameter as the denominator. CONTRAST:  23mL ISOVUE-370 IOPAMIDOL (ISOVUE-370) INJECTION 76% COMPARISON:  09/24/2019 head CT. 09/24/2019 MRI head, MRA head and MRA neck. FINDINGS: CT HEAD FINDINGS Brain: No acute infarct or intracranial hemorrhage. No mass lesion. No midline shift, ventriculomegaly or extra-axial fluid collection. Cerebral volume within normal limits. Minimal chronic microvascular ischemic changes. Vascular: No hyperdense vessel. Bilateral skull base atherosclerotic calcifications. Skull: Negative for fracture or focal lesion. Sinuses/Orbits: Normal orbits. Small left maxillary sinus  mucous retention cyst. No mastoid effusion. Other: None. Review of the MIP images confirms the above findings CTA NECK FINDINGS Aortic arch: Standard branching. Imaged portion shows no evidence of aneurysm or dissection. Mild calcified atheromatous plaque involving the aortic arch and great vessel origins. No significant stenosis of the major arch vessel origins. Right carotid system: Carotid bifurcation atherosclerotic calcifications with approximately 80-90% narrowing of the carotid bulb and approximately 60-70% narrowing of the proximal right ICA. No evidence of dissection or aneurysm. Left carotid system: Noncalcified atheromatous plaque along the proximal left common carotid resulting in approximately 40% luminal narrowing. Multifocal plaque ulceration is also seen along the proximal left common carotid. Carotid bifurcation atherosclerotic calcifications with approximately 30% luminal narrowing the carotid bulb and proximal ICA. No evidence of dissection or aneurysm. Vertebral arteries: Codominant. No evidence of dissection or aneurysm. Left vertebral artery origin atherosclerotic calcifications with approximately 30% luminal narrowing. No high-grade narrowing involving the right V1 segment. Skeleton: Multilevel spondylosis. Other neck: No adenopathy.  No soft tissue mass. Upper chest: Emphysema. 6 mm right apical ground-glass nodule is unchanged. Partially imaged right upper lung reticular opacities. Review of the MIP images confirms the above findings CTA HEAD FINDINGS Anterior circulation: No high-grade stenosis, proximal occlusion, aneurysm, or vascular malformation. Right A1 segment hypoplasia. Bilateral carotid siphon atherosclerotic calcifications with mild narrowing of the intracavernous segments. Posterior circulation: No significant stenosis, proximal occlusion, aneurysm, or vascular malformation. Fetal origin of the right PCA. Venous sinuses: Patent. Anatomic variants: As detailed above. Review of  the MIP images confirms the above findings IMPRESSION: 80-90% narrowing of the right carotid bulb and 60-70% narrowing of the proximal right ICA secondary to calcified atheromatous plaque. Proximal left common carotid atheromatous plaque with approximately 40% luminal narrowing. Multifocal plaque ulcerations are also seen along the proximal left common carotid artery. 30% luminal narrowing of the left carotid bulb and proximal left ICA. Left vertebral artery origin atherosclerotic calcifications with approximately 30% luminal narrowing. Bilateral carotid siphon atherosclerotic calcifications with mild bilateral intracavernous ICA narrowing. Electronically Signed   By: Primitivo Gauze M.D.   On: 09/25/2019 14:42   CT ANGIO NECK W OR WO CONTRAST  Result Date: 09/25/2019 CLINICAL DATA:  Neuro deficit. EXAM: CT ANGIOGRAPHY HEAD AND NECK TECHNIQUE: Multidetector CT imaging of the head and neck was performed using the standard protocol during bolus administration of intravenous contrast. Multiplanar CT image reconstructions and MIPs were obtained to evaluate the vascular anatomy. Carotid  stenosis measurements (when applicable) are obtained utilizing NASCET criteria, using the distal internal carotid diameter as the denominator. CONTRAST:  83mL ISOVUE-370 IOPAMIDOL (ISOVUE-370) INJECTION 76% COMPARISON:  09/24/2019 head CT. 09/24/2019 MRI head, MRA head and MRA neck. FINDINGS: CT HEAD FINDINGS Brain: No acute infarct or intracranial hemorrhage. No mass lesion. No midline shift, ventriculomegaly or extra-axial fluid collection. Cerebral volume within normal limits. Minimal chronic microvascular ischemic changes. Vascular: No hyperdense vessel. Bilateral skull base atherosclerotic calcifications. Skull: Negative for fracture or focal lesion. Sinuses/Orbits: Normal orbits. Small left maxillary sinus mucous retention cyst. No mastoid effusion. Other: None. Review of the MIP images confirms the above findings CTA NECK  FINDINGS Aortic arch: Standard branching. Imaged portion shows no evidence of aneurysm or dissection. Mild calcified atheromatous plaque involving the aortic arch and great vessel origins. No significant stenosis of the major arch vessel origins. Right carotid system: Carotid bifurcation atherosclerotic calcifications with approximately 80-90% narrowing of the carotid bulb and approximately 60-70% narrowing of the proximal right ICA. No evidence of dissection or aneurysm. Left carotid system: Noncalcified atheromatous plaque along the proximal left common carotid resulting in approximately 40% luminal narrowing. Multifocal plaque ulceration is also seen along the proximal left common carotid. Carotid bifurcation atherosclerotic calcifications with approximately 30% luminal narrowing the carotid bulb and proximal ICA. No evidence of dissection or aneurysm. Vertebral arteries: Codominant. No evidence of dissection or aneurysm. Left vertebral artery origin atherosclerotic calcifications with approximately 30% luminal narrowing. No high-grade narrowing involving the right V1 segment. Skeleton: Multilevel spondylosis. Other neck: No adenopathy.  No soft tissue mass. Upper chest: Emphysema. 6 mm right apical ground-glass nodule is unchanged. Partially imaged right upper lung reticular opacities. Review of the MIP images confirms the above findings CTA HEAD FINDINGS Anterior circulation: No high-grade stenosis, proximal occlusion, aneurysm, or vascular malformation. Right A1 segment hypoplasia. Bilateral carotid siphon atherosclerotic calcifications with mild narrowing of the intracavernous segments. Posterior circulation: No significant stenosis, proximal occlusion, aneurysm, or vascular malformation. Fetal origin of the right PCA. Venous sinuses: Patent. Anatomic variants: As detailed above. Review of the MIP images confirms the above findings IMPRESSION: 80-90% narrowing of the right carotid bulb and 60-70% narrowing of  the proximal right ICA secondary to calcified atheromatous plaque. Proximal left common carotid atheromatous plaque with approximately 40% luminal narrowing. Multifocal plaque ulcerations are also seen along the proximal left common carotid artery. 30% luminal narrowing of the left carotid bulb and proximal left ICA. Left vertebral artery origin atherosclerotic calcifications with approximately 30% luminal narrowing. Bilateral carotid siphon atherosclerotic calcifications with mild bilateral intracavernous ICA narrowing. Electronically Signed   By: Primitivo Gauze M.D.   On: 09/25/2019 14:42   MR ANGIO HEAD WO CONTRAST  Result Date: 09/24/2019 CLINICAL DATA:  Right-sided facial numbness with vision changes. EXAM: MR HEAD WITHOUT CONTRAST MR CIRCLE OF WILLIS WITHOUT CONTRAST MRA OF THE NECK WITHOUT AND WITH CONTRAST TECHNIQUE: Multiplanar, multiecho pulse sequences of the brain, circle of willis and surrounding structures were obtained without intravenous contrast. Angiographic images of the neck were obtained using MRA technique without and with intravenous contrast. CONTRAST:  24mL GADAVIST GADOBUTROL 1 MMOL/ML IV SOLN COMPARISON:  None. FINDINGS: MR HEAD FINDINGS Brain: No acute infarct, acute hemorrhage or extra-axial collection. Multifocal hyperintense T2-weighted signal within the white matter. Normal volume of CSF spaces. Single chronic microhemorrhage from the left cerebellum. Normal midline structures. Vascular: Normal flow voids. Skull and upper cervical spine: Normal marrow signal. Sinuses/Orbits: Negative. Other: None. MR CIRCLE OF WILLIS FINDINGS POSTERIOR CIRCULATION: --Vertebral  arteries: Normal V4 segments. --Inferior cerebellar arteries: Normal. --Basilar artery: Normal. --Superior cerebellar arteries: Normal. --Posterior cerebral arteries: Normal. ANTERIOR CIRCULATION: --Intracranial internal carotid arteries: Normal. --Anterior cerebral arteries (ACA): Normal. Both A1 segments are present.  Patent anterior communicating artery (a-comm). --Middle cerebral arteries (MCA): Normal. MRA NECK FINDINGS There is severe stenosis of the proximal right internal carotid artery (series 1096, image 165). The remainder of the ICA is normal. The left carotid system is normal. Vertebral system is codominant. There stenosis of the left vertebral artery origin. Otherwise the vertebral arteries normal. IMPRESSION: 1. No acute intracranial abnormality. 2. Normal intracranial MRA. 3. 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 4. Left vertebral artery origin narrowing. Electronically Signed   By: Ulyses Jarred M.D.   On: 09/24/2019 22:04   MR ANGIO NECK W WO CONTRAST  Result Date: 09/24/2019 CLINICAL DATA:  Right-sided facial numbness with vision changes. EXAM: MR HEAD WITHOUT CONTRAST MR CIRCLE OF WILLIS WITHOUT CONTRAST MRA OF THE NECK WITHOUT AND WITH CONTRAST TECHNIQUE: Multiplanar, multiecho pulse sequences of the brain, circle of willis and surrounding structures were obtained without intravenous contrast. Angiographic images of the neck were obtained using MRA technique without and with intravenous contrast. CONTRAST:  37mL GADAVIST GADOBUTROL 1 MMOL/ML IV SOLN COMPARISON:  None. FINDINGS: MR HEAD FINDINGS Brain: No acute infarct, acute hemorrhage or extra-axial collection. Multifocal hyperintense T2-weighted signal within the white matter. Normal volume of CSF spaces. Single chronic microhemorrhage from the left cerebellum. Normal midline structures. Vascular: Normal flow voids. Skull and upper cervical spine: Normal marrow signal. Sinuses/Orbits: Negative. Other: None. MR CIRCLE OF WILLIS FINDINGS POSTERIOR CIRCULATION: --Vertebral arteries: Normal V4 segments. --Inferior cerebellar arteries: Normal. --Basilar artery: Normal. --Superior cerebellar arteries: Normal. --Posterior cerebral arteries: Normal. ANTERIOR CIRCULATION: --Intracranial internal carotid arteries: Normal. --Anterior cerebral  arteries (ACA): Normal. Both A1 segments are present. Patent anterior communicating artery (a-comm). --Middle cerebral arteries (MCA): Normal. MRA NECK FINDINGS There is severe stenosis of the proximal right internal carotid artery (series 1096, image 165). The remainder of the ICA is normal. The left carotid system is normal. Vertebral system is codominant. There stenosis of the left vertebral artery origin. Otherwise the vertebral arteries normal. IMPRESSION: 1. No acute intracranial abnormality. 2. Normal intracranial MRA. 3. 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 4. Left vertebral artery origin narrowing. Electronically Signed   By: Ulyses Jarred M.D.   On: 09/24/2019 22:04   MR BRAIN WO CONTRAST  Result Date: 09/24/2019 CLINICAL DATA:  Right-sided facial numbness with vision changes. EXAM: MR HEAD WITHOUT CONTRAST MR CIRCLE OF WILLIS WITHOUT CONTRAST MRA OF THE NECK WITHOUT AND WITH CONTRAST TECHNIQUE: Multiplanar, multiecho pulse sequences of the brain, circle of willis and surrounding structures were obtained without intravenous contrast. Angiographic images of the neck were obtained using MRA technique without and with intravenous contrast. CONTRAST:  26mL GADAVIST GADOBUTROL 1 MMOL/ML IV SOLN COMPARISON:  None. FINDINGS: MR HEAD FINDINGS Brain: No acute infarct, acute hemorrhage or extra-axial collection. Multifocal hyperintense T2-weighted signal within the white matter. Normal volume of CSF spaces. Single chronic microhemorrhage from the left cerebellum. Normal midline structures. Vascular: Normal flow voids. Skull and upper cervical spine: Normal marrow signal. Sinuses/Orbits: Negative. Other: None. MR CIRCLE OF WILLIS FINDINGS POSTERIOR CIRCULATION: --Vertebral arteries: Normal V4 segments. --Inferior cerebellar arteries: Normal. --Basilar artery: Normal. --Superior cerebellar arteries: Normal. --Posterior cerebral arteries: Normal. ANTERIOR CIRCULATION: --Intracranial internal  carotid arteries: Normal. --Anterior cerebral arteries (ACA): Normal. Both A1 segments are present. Patent anterior communicating  artery (a-comm). --Middle cerebral arteries (MCA): Normal. MRA NECK FINDINGS There is severe stenosis of the proximal right internal carotid artery (series 1096, image 165). The remainder of the ICA is normal. The left carotid system is normal. Vertebral system is codominant. There stenosis of the left vertebral artery origin. Otherwise the vertebral arteries normal. IMPRESSION: 1. No acute intracranial abnormality. 2. Normal intracranial MRA. 3. 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 4. Left vertebral artery origin narrowing. Electronically Signed   By: Ulyses Jarred M.D.   On: 09/24/2019 22:04   ECHOCARDIOGRAM COMPLETE  Result Date: 09/25/2019    ECHOCARDIOGRAM REPORT   Patient Name:   Peter Jordan Date of Exam: 09/25/2019 Medical Rec #:  [id number]     Height:       67.0 in Accession #:    [id number]    Weight:       138.0 lb Date of Birth:  07-31-1955      BSA:          1.727 m Patient Age:    32 years      BP:           121/67 mmHg Patient Gender: M             HR:           72 bpm. Exam Location:  Inpatient Procedure: 2D Echo Indications:    TIA 435.9  History:        Patient has no prior history of Echocardiogram examinations.                 CAD.  Sonographer:    Johny Chess Referring Phys: [id number] [place]  1. Left ventricular ejection fraction, by estimation, is 45 to 50%. The left ventricle has mildly decreased function. The left ventricle demonstrates global hypokinesis. Left ventricular diastolic parameters are consistent with Grade I diastolic dysfunction (impaired relaxation).  2. Right ventricular systolic function is normal. The right ventricular size is normal. There is normal pulmonary artery systolic pressure. The estimated right ventricular systolic pressure is 87.5 mmHg.  3. The mitral valve is normal in structure. Mild  mitral valve regurgitation. No evidence of mitral stenosis.  4. The aortic valve is normal in structure. Aortic valve regurgitation is not visualized. No aortic stenosis is present.  5. The inferior vena cava is normal in size with greater than 50% respiratory variability, suggesting right atrial pressure of 3 mmHg. FINDINGS  Left Ventricle: Left ventricular ejection fraction, by estimation, is 45 to 50%. The left ventricle has mildly decreased function. The left ventricle demonstrates global hypokinesis. The left ventricular internal cavity size was normal in size. There is  no left ventricular hypertrophy. Left ventricular diastolic parameters are consistent with Grade I diastolic dysfunction (impaired relaxation). Normal left ventricular filling pressure. Right Ventricle: The right ventricular size is normal. No increase in right ventricular wall thickness. Right ventricular systolic function is normal. There is normal pulmonary artery systolic pressure. The tricuspid regurgitant velocity is 2.28 m/s, and  with an assumed right atrial pressure of 3 mmHg, the estimated right ventricular systolic pressure is 64.3 mmHg. Left Atrium: Left atrial size was normal in size. Right Atrium: Right atrial size was normal in size. Pericardium: There is no evidence of pericardial effusion. Mitral Valve: The mitral valve is normal in structure. There is mild thickening of the mitral valve leaflet(s). Normal mobility of the mitral valve leaflets. Mild mitral annular calcification. Mild mitral valve regurgitation. No evidence  of mitral valve stenosis. Tricuspid Valve: The tricuspid valve is normal in structure. Tricuspid valve regurgitation is trivial. No evidence of tricuspid stenosis. Aortic Valve: The aortic valve is normal in structure. Aortic valve regurgitation is not visualized. No aortic stenosis is present. Pulmonic Valve: The pulmonic valve was normal in structure. Pulmonic valve regurgitation is not visualized. No  evidence of pulmonic stenosis. Aorta: The aortic root is normal in size and structure. Venous: The inferior vena cava is normal in size with greater than 50% respiratory variability, suggesting right atrial pressure of 3 mmHg. IAS/Shunts: No atrial level shunt detected by color flow Doppler.  LEFT VENTRICLE PLAX 2D LVIDd:         5.20 cm  Diastology LVIDs:         4.00 cm  LV e' lateral:   7.51 cm/s LV PW:         0.90 cm  LV E/e' lateral: 7.1 LV IVS:        0.90 cm  LV e' medial:    7.07 cm/s LVOT diam:     2.20 cm  LV E/e' medial:  7.6 LV SV:         55 LV SV Index:   32 LVOT Area:     3.80 cm  RIGHT VENTRICLE             IVC RV S prime:     11.30 cm/s  IVC diam: 1.00 cm TAPSE (M-mode): 2.2 cm LEFT ATRIUM             Index       RIGHT ATRIUM           Index LA diam:        3.80 cm 2.20 cm/m  RA Area:     11.60 cm LA Vol (A2C):   40.2 ml 23.27 ml/m RA Volume:   26.30 ml  15.23 ml/m LA Vol (A4C):   39.2 ml 22.70 ml/m LA Biplane Vol: 41.4 ml 23.97 ml/m  AORTIC VALVE LVOT Vmax:   69.20 cm/s LVOT Vmean:  47.600 cm/s LVOT VTI:    0.144 m  AORTA Ao Root diam: 3.10 cm MITRAL VALVE               TRICUSPID VALVE MV Area (PHT): 3.48 cm    TR Peak grad:   20.8 mmHg MV Decel Time: 218 msec    TR Vmax:        228.00 cm/s MR PISA:        1.01 cm MR PISA Radius: 0.40 cm    SHUNTS MV E velocity: 53.60 cm/s  Systemic VTI:  0.14 m MV A velocity: 83.10 cm/s  Systemic Diam: 2.20 cm MV E/A ratio:  0.65 Fransico Him MD Electronically signed by Fransico Him MD Signature Date/Time: 09/25/2019/10:17:53 AM    Final    CT HEAD CODE STROKE WO CONTRAST  Result Date: 09/24/2019 CLINICAL DATA:  Code stroke. Sudden onset numbness to the right-sided face beginning this morning EXAM: CT HEAD WITHOUT CONTRAST TECHNIQUE: Contiguous axial images were obtained from the base of the skull through the vertex without intravenous contrast. COMPARISON:  None. FINDINGS: Brain: No evidence of acute infarction, hemorrhage, hydrocephalus, or mass  lesion/mass effect. Prominent CSF along cerebral convexities. There could be trace subdural hygroma on both sides towards the vertex, but no definite cortical vein mass effect. Vascular: No hyperdense vessel or unexpected calcification. Skull: Normal. Negative for fracture or focal lesion. Sinuses/Orbits: No acute finding. Other: These results  were called by telephone at the time of interpretation on 09/24/2019 at 12:44 pm to provider [facility] , who verbally acknowledged these results. ASPECTS [facility] Stroke Program Early CT Score) - Ganglionic level infarction (caudate, lentiform nuclei, internal capsule, insula, M1-M3 cortex): 7 - Supraganglionic infarction (M4-M6 cortex): 3 Total score (0-10 with 10 being normal): 10 IMPRESSION: 1. Negative for hemorrhage or visible infarct.ASPECTS is 10. 2. Prominent CSF at the convexities, normal variant versus trace subdural hygromas. Electronically Signed   By: Monte Fantasia M.D.   On: 09/24/2019 12:46   VAS US CAROTID  Result Date: 09/25/2019 Carotid Arterial Duplex Study Indications: Visual disturbance and stenosis on MRA. Limitations  Today's exam was limited due to the high bifurcation of the              carotid, heavy calcification and the resulting shadowing and              previous neck surgeries and scarring. Performing Technologist: Antonieta Pert RDMS, RVT  Examination Guidelines: A complete evaluation includes B-mode imaging, spectral Doppler, color Doppler, and power Doppler as needed of all accessible portions of each vessel. Bilateral testing is considered an integral part of a complete examination. Limited examinations for reoccurring indications may be performed as noted.  Right Carotid Findings: +----------+--------+--------+--------+--------------------+------------------+           PSV cm/sEDV cm/sStenosisPlaque Description  Comments           +----------+--------+--------+--------+--------------------+------------------+ CCA Prox  72       19                                                     +----------+--------+--------+--------+--------------------+------------------+ CCA Mid                                               intimal thickening +----------+--------+--------+--------+--------------------+------------------+ CCA Distal106     29                                                     +----------+--------+--------+--------+--------------------+------------------+ ICA Prox  279     105     60-79%  diffuse and calcific                   +----------+--------+--------+--------+--------------------+------------------+ ICA Mid   125     36                                                     +----------+--------+--------+--------+--------------------+------------------+ ICA Distal66      27                                                     +----------+--------+--------+--------+--------------------+------------------+ ECA       209     48                                                     +----------+--------+--------+--------+--------------------+------------------+ +----------+--------+-------+------------+-------------------+  PSV cm/sEDV cmsDescribe    Arm Pressure (mmHG) +----------+--------+-------+------------+-------------------+ Subclavian               Not assessed                    +----------+--------+-------+------------+-------------------+ +---------+--------+--+--------+--+---------+ VertebralPSV cm/s38EDV cm/s12Antegrade +---------+--------+--+--------+--+---------+ heavy calcification could underestimate severity of stenosis Left Carotid Findings: +----------+--------+--------+--------+---------------------+------------------+           PSV cm/sEDV cm/sStenosisPlaque Description   Comments           +----------+--------+--------+--------+---------------------+------------------+ CCA Prox  114     39                                                       +----------+--------+--------+--------+---------------------+------------------+ CCA Mid   167     46                                   intimal thickening +----------+--------+--------+--------+---------------------+------------------+ CCA Distal179     57                                                      +----------+--------+--------+--------+---------------------+------------------+ ICA Prox  135     47      40-59%  diffuse, heterogenous                                                     and calcific                            +----------+--------+--------+--------+---------------------+------------------+ ICA Mid   154     52      40-59%                                          +----------+--------+--------+--------+---------------------+------------------+ ICA Distal110     36                                                      +----------+--------+--------+--------+---------------------+------------------+ ECA       121     27                                                      +----------+--------+--------+--------+---------------------+------------------+ +----------+--------+--------+--------+-------------------+           PSV cm/sEDV cm/sDescribeArm Pressure (mmHG) +----------+--------+--------+--------+-------------------+ Subclavian122                                         +----------+--------+--------+--------+-------------------+ +---------+--------+--+--------+--+---------+  VertebralPSV cm/s65EDV cm/s24Antegrade +---------+--------+--+--------+--+---------+   Summary: Right Carotid: Velocities in the right ICA are consistent with a 60-79%                stenosis. Difficult examination bilaterally due to plaque and                scaring. Heavy calcification near prox ICA could underestimate                severity of stenosis. Left Carotid: Velocities in the left ICA are consistent with a 40-59% stenosis. Vertebrals:  Bilateral vertebral arteries demonstrate antegrade flow. *See table(s) above for measurements and observations.  Electronically signed by Deitra Mayo MD on 09/25/2019 at 4:07:38 PM.    Final     Medications:   . aspirin  81 mg Oral Daily  . atorvastatin  80 mg Oral Daily  . clopidogrel  75 mg Oral Daily  . enoxaparin (LOVENOX) injection  40 mg Subcutaneous Q24H   Continuous Infusions:   LOS: 1 day   Geradine Girt  [facility]   How to contact the [facility] Attending or Consulting provider [place] or covering provider during after hours [place], for this patient?  1. Check the care team in [facility] and look for a) attending/consulting [facility] provider listed and b) the [facility] team listed 2. Log into [email or web address] and use [place]'s universal password to access. If you do not have the password, please contact the hospital operator. 3. Locate the [facility] provider you are looking for under [facility] and page to a number that you can be directly reached. 4. If you still have difficulty reaching the provider, please page the [facility] (Director on Call) for the Hospitalists listed on amion for assistance.  09/26/2019, 11:26 AM

## 2019-09-26 NOTE — H&P (View-Only) (Signed)
   VASCULAR SURGERY ASSESSMENT & PLAN:   SYMPTOMATIC RIGHT CAROTID STENOSIS: I have reviewed his CT angiogram that was done yesterday.  He has an adequate length of common carotid artery for transcarotid stenting.  However he has almost circumferential calcified plaque at the carotid bifurcation and for this reason may not be a candidate for TCAR.  I will review the films today with my partners.  If he is not a candidate for transcarotid stenting and I think the best option would be maximal medical therapy which she is now on including aspirin, Plavix, and a statin.  When these events occurred he was not on any of these medications.  ADDENDUM: I have reviewed her CT angiogram with Dr. Donzetta Matters.  We feel that the patient is a reasonable candidate for transcarotid stenting and we will try to schedule this for tomorrow.  He is on aspirin, Plavix, and a statin.   SUBJECTIVE:   No specific complaints this morning.  No new issues overnight. ROS: No CP, SOB  PHYSICAL EXAM:   Vitals:   09/25/19 1758 09/25/19 2011 09/26/19 0110 09/26/19 0536  BP: 124/76 139/81 (!) 141/76 (!) 142/86  Pulse: 76 72 93 86  Resp: 17 16 16 16   Temp: 97.9 F (36.6 C) 98.4 F (36.9 C) 98.5 F (36.9 C) (!) 97.5 F (36.4 C)  TempSrc: Oral Oral Oral Oral  SpO2: 97% 96% 95% 94%  Weight:      Height:       Good strength in the upper extremities and lower extremities. Lungs clear  LABS:   CT ANGIOGRAM HEAD AND NECK: I have reviewed the images of the CT angiogram of the head and neck.  He has an extensive calcified plaque at the right carotid bifurcation that is almost circumferential.  Radiology noted a greater than 80% stenosis to the right carotid bulb.  There was a 40% left common carotid artery stenosis.  He was also noted to be bilateral carotid siphon atherosclerotic calcifications with mild bilateral intracavernous ICA narrowing.  CAROTID DUPLEX: I have reviewed his carotid duplex scan from yesterday.  This showed a  60 to 79% right carotid stenosis with a 40 to 59% left carotid stenosis.  Both vertebral arteries were patent with antegrade flow.  ECHO: I reviewed his echo from yesterday.  This shows mildly reduced LV function with an ejection fraction of 45 to 50%.   PROBLEM LIST:    Principal Problem:   Amaurosis fugax Active Problems:   H/O tongue cancer   Mild renal insufficiency   Hyperlipidemia   Prediabetes   Internal carotid artery stenosis, right   CURRENT MEDS:   . aspirin  81 mg Oral Daily  . atorvastatin  80 mg Oral Daily  . clopidogrel  75 mg Oral Daily  . enoxaparin (LOVENOX) injection  40 mg Subcutaneous Q24H    Deitra Mayo Office: [phone] 09/26/2019

## 2019-09-26 NOTE — Progress Notes (Signed)
   I reviewed the patient's case and discussed with Dr. Scot Dock.  I also discussed the risk and benefits of procedure with the patient and his mother and he is agreeable to proceed tomorrow.  Continue aspirin and Plavix.  N.p.o. past midnight.  Shakelia Scrivner C. Donzetta Matters, MD [facility] [place] Office: [phone] Pager: [phone]

## 2019-09-26 NOTE — Plan of Care (Signed)
  RD consulted for nutrition education regarding Pre-diabetes.   Lab Results  Component Value Date   HGBA1C 5.9 (H) 09/25/2019    Mother at pt bedside. RD provided "General Healthful Nutrition Therapy" handout from the Academy of [employer]. Discussed different food groups and their effects on blood sugar, emphasizing carbohydrate-containing foods. Discussed importance of portion control and including fresh fruits and vegetables, as well as whole grain in the diet. Pt reports consuming large amounts of Dr. Malachi Bonds soda. Discussed beverages alteratives to decrease amount of sugar sweetened beverages consumed. Additionally discussed decreasing the amount of sodium within the diet for a more heart healthy diet. Teach back method used.  Expect good compliance.  Body mass index is 21.61 kg/m. Pt meets criteria for normal based on current BMI.  Current diet order is heart healthy. Pt reports having a good appetite currently and PTA with no difficulties. Labs and medications reviewed. No further nutrition interventions warranted at this time. RD contact information provided. If additional nutrition issues arise, please re-consult RD.  Corrin Parker, MS, RD, LDN RD pager number/after hours weekend pager number on Amion.

## 2019-09-27 ENCOUNTER — Inpatient Hospital Stay (HOSPITAL_COMMUNITY): Admitting: Anesthesiology

## 2019-09-27 ENCOUNTER — Encounter (HOSPITAL_COMMUNITY)
Admission: EM | Disposition: A | Discharge status: Home / Self Care | Payer: Self-pay | Source: Home / Self Care | Attending: Internal Medicine

## 2019-09-27 ENCOUNTER — Inpatient Hospital Stay (HOSPITAL_COMMUNITY)

## 2019-09-27 HISTORY — PX: ULTRASOUND GUIDANCE FOR VASCULAR ACCESS: SHX6516

## 2019-09-27 HISTORY — PX: TRANSCAROTID ARTERY REVASCULARIZATIONÂ: SHX6778

## 2019-09-27 HISTORY — PX: TRANSCAROTID ARTERY REVASCULARIZATION (TCAR): SHX6784

## 2019-09-27 LAB — BASIC METABOLIC PANEL
Anion gap: 13 (ref 5–15)
BUN: 19 mg/dL (ref 8–23)
CO2: 23 mmol/L (ref 22–32)
Calcium: 8.8 mg/dL — ABNORMAL LOW (ref 8.9–10.3)
Chloride: 102 mmol/L (ref 98–111)
Creatinine, Ser: 1.42 mg/dL — ABNORMAL HIGH (ref 0.61–1.24)
GFR calc Af Amer: >60 mL/min (ref >60)
GFR calc non Af Amer: 52 mL/min — ABNORMAL LOW (ref >60)
Glucose, Bld: 122 mg/dL — ABNORMAL HIGH (ref 70–99)
Potassium: 4.6 mmol/L (ref 3.5–5.1)
Sodium: 138 mmol/L (ref 135–145)

## 2019-09-27 LAB — CBC
HCT: 38.3 % — ABNORMAL LOW (ref 39.0–52.0)
Hemoglobin: 12.9 g/dL — ABNORMAL LOW (ref 13.0–17.0)
MCH: 32.3 pg (ref 26.0–34.0)
MCHC: 33.7 g/dL (ref 30.0–36.0)
MCV: 96 fL (ref 80.0–100.0)
Platelets: 311 10*3/uL (ref 150–400)
RBC: 3.99 MIL/uL — ABNORMAL LOW (ref 4.22–5.81)
RDW: 13 % (ref 11.5–15.5)
WBC: 14.3 10*3/uL — ABNORMAL HIGH (ref 4.0–10.5)
nRBC: 0 % (ref 0.0–0.2)

## 2019-09-27 SURGERY — TRANSCAROTID ARTERY REVASCULARIZATION (TCAR)
Anesthesia: General | Site: Neck | Laterality: Right

## 2019-09-27 MED ORDER — POTASSIUM CHLORIDE CRYS ER 20 MEQ PO TBCR: 20.0000 meq | EXTENDED_RELEASE_TABLET | Freq: Every day | ORAL | Status: DC | PRN

## 2019-09-27 MED ORDER — SUGAMMADEX SODIUM 200 MG/2ML IV SOLN
INTRAVENOUS | Status: DC | PRN
  Administered 2019-09-27: 200 mg via INTRAVENOUS

## 2019-09-27 MED ORDER — ROCURONIUM BROMIDE 10 MG/ML (PF) SYRINGE
PREFILLED_SYRINGE | INTRAVENOUS | Status: AC
  Filled 2019-09-27: qty 10

## 2019-09-27 MED ORDER — LIDOCAINE 2% (20 MG/ML) 5 ML SYRINGE
INTRAMUSCULAR | Status: AC
  Filled 2019-09-27: qty 5

## 2019-09-27 MED ORDER — DEXAMETHASONE SODIUM PHOSPHATE 10 MG/ML IJ SOLN
INTRAMUSCULAR | Status: AC
  Filled 2019-09-27: qty 1

## 2019-09-27 MED ORDER — SODIUM CHLORIDE 0.9 % IV SOLN
INTRAVENOUS | Status: AC
  Filled 2019-09-27: qty 1.2

## 2019-09-27 MED ORDER — ONDANSETRON HCL 4 MG/2ML IJ SOLN
4.0000 mg | Freq: Four times a day (QID) | INTRAMUSCULAR | Status: DC | PRN
  Filled 2019-09-27: qty 2

## 2019-09-27 MED ORDER — SODIUM CHLORIDE 0.9 % IV SOLN
500.0000 mL | Freq: Once | INTRAVENOUS | Status: AC | PRN
  Administered 2019-09-27: 500 mL via INTRAVENOUS

## 2019-09-27 MED ORDER — PHENOL 1.4 % MT LIQD: 1.0000 | OROMUCOSAL | Status: DC | PRN

## 2019-09-27 MED ORDER — HYDRALAZINE HCL 20 MG/ML IJ SOLN: 5.0000 mg | INTRAMUSCULAR | Status: DC | PRN

## 2019-09-27 MED ORDER — ROCURONIUM BROMIDE 10 MG/ML (PF) SYRINGE
PREFILLED_SYRINGE | INTRAVENOUS | Status: DC | PRN
  Administered 2019-09-27 (×2): 50 mg via INTRAVENOUS

## 2019-09-27 MED ORDER — HEPARIN SODIUM (PORCINE) 1000 UNIT/ML IJ SOLN
INTRAMUSCULAR | Status: DC | PRN
  Administered 2019-09-27: 8000 [IU] via INTRAVENOUS

## 2019-09-27 MED ORDER — FENTANYL CITRATE (PF) 250 MCG/5ML IJ SOLN
INTRAMUSCULAR | Status: AC
  Filled 2019-09-27: qty 5

## 2019-09-27 MED ORDER — 0.9 % SODIUM CHLORIDE (POUR BTL) OPTIME
TOPICAL | Status: DC | PRN
  Administered 2019-09-27: 1000 mL

## 2019-09-27 MED ORDER — DOCUSATE SODIUM 100 MG PO CAPS: 100.0000 mg | ORAL_CAPSULE | Freq: Every day | ORAL | Status: DC

## 2019-09-27 MED ORDER — HEMOSTATIC AGENTS (NO CHARGE) OPTIME
TOPICAL | Status: DC | PRN
  Administered 2019-09-27: 1 via TOPICAL

## 2019-09-27 MED ORDER — PROTAMINE SULFATE 10 MG/ML IV SOLN
INTRAVENOUS | Status: DC | PRN
  Administered 2019-09-27: 50 mg via INTRAVENOUS

## 2019-09-27 MED ORDER — CEFAZOLIN SODIUM-DEXTROSE 2-3 GM-%(50ML) IV SOLR
INTRAVENOUS | Status: DC | PRN
  Administered 2019-09-27: 2 g via INTRAVENOUS

## 2019-09-27 MED ORDER — MORPHINE SULFATE (PF) 2 MG/ML IV SOLN: 2.0000 mg | INTRAVENOUS | Status: DC | PRN

## 2019-09-27 MED ORDER — PROPOFOL 10 MG/ML IV BOLUS
INTRAVENOUS | Status: AC
  Filled 2019-09-27: qty 20

## 2019-09-27 MED ORDER — MIDAZOLAM HCL 2 MG/2ML IJ SOLN
INTRAMUSCULAR | Status: AC
  Filled 2019-09-27: qty 2

## 2019-09-27 MED ORDER — MIDAZOLAM HCL 2 MG/2ML IJ SOLN
INTRAMUSCULAR | Status: DC | PRN
  Administered 2019-09-27: 2 mg via INTRAVENOUS

## 2019-09-27 MED ORDER — LIDOCAINE HCL (PF) 1 % IJ SOLN
INTRAMUSCULAR | Status: AC
  Filled 2019-09-27: qty 5

## 2019-09-27 MED ORDER — CEFAZOLIN SODIUM 1 G IJ SOLR
INTRAMUSCULAR | Status: AC
  Filled 2019-09-27: qty 20

## 2019-09-27 MED ORDER — ALBUMIN HUMAN 5 % IV SOLN: INTRAVENOUS | Status: DC | PRN

## 2019-09-27 MED ORDER — SODIUM CHLORIDE 0.9 % IV SOLN
INTRAVENOUS | Status: DC | PRN
  Administered 2019-09-27: 500 mL

## 2019-09-27 MED ORDER — OXYCODONE-ACETAMINOPHEN 5-325 MG PO TABS
1.0000 | ORAL_TABLET | Freq: Four times a day (QID) | ORAL | Status: DC | PRN
  Administered 2019-09-28: 1 via ORAL
  Administered 2019-09-28: 2 via ORAL
  Filled 2019-09-27: qty 2
  Filled 2019-09-27: qty 1

## 2019-09-27 MED ORDER — IODIXANOL 320 MG/ML IV SOLN
INTRAVENOUS | Status: DC | PRN
  Administered 2019-09-27: 50 mL

## 2019-09-27 MED ORDER — LIDOCAINE 2% (20 MG/ML) 5 ML SYRINGE
INTRAMUSCULAR | Status: DC | PRN
  Administered 2019-09-27: 60 mg via INTRAVENOUS

## 2019-09-27 MED ORDER — GLYCOPYRROLATE 0.2 MG/ML IJ SOLN
INTRAMUSCULAR | Status: DC | PRN
  Administered 2019-09-27 (×2): .1 mg via INTRAVENOUS

## 2019-09-27 MED ORDER — PHENYLEPHRINE HCL-NACL 10-0.9 MG/250ML-% IV SOLN
INTRAVENOUS | Status: DC | PRN
  Administered 2019-09-27: 50 ug/min via INTRAVENOUS

## 2019-09-27 MED ORDER — LABETALOL HCL 5 MG/ML IV SOLN
INTRAVENOUS | Status: DC | PRN
  Administered 2019-09-27 (×2): 10 mg via INTRAVENOUS

## 2019-09-27 MED ORDER — METOPROLOL TARTRATE 5 MG/5ML IV SOLN: 2.0000 mg | INTRAVENOUS | Status: DC | PRN

## 2019-09-27 MED ORDER — MAGNESIUM SULFATE 2 GM/50ML IV SOLN: 2.0000 g | Freq: Every day | INTRAVENOUS | Status: DC | PRN

## 2019-09-27 MED ORDER — DEXAMETHASONE SODIUM PHOSPHATE 10 MG/ML IJ SOLN
INTRAMUSCULAR | Status: DC | PRN
  Administered 2019-09-27: 10 mg via INTRAVENOUS

## 2019-09-27 MED ORDER — LABETALOL HCL 5 MG/ML IV SOLN: 10.0000 mg | INTRAVENOUS | Status: DC | PRN

## 2019-09-27 MED ORDER — FENTANYL CITRATE (PF) 250 MCG/5ML IJ SOLN
INTRAMUSCULAR | Status: DC | PRN
Start: 2019-09-27 — End: 2019-09-27
  Administered 2019-09-27: 50 ug via INTRAVENOUS
  Administered 2019-09-27: 150 ug via INTRAVENOUS

## 2019-09-27 MED ORDER — GLYCOPYRROLATE PF 0.2 MG/ML IJ SOSY
PREFILLED_SYRINGE | INTRAMUSCULAR | Status: AC
  Filled 2019-09-27: qty 1

## 2019-09-27 MED ORDER — FENTANYL CITRATE (PF) 100 MCG/2ML IJ SOLN: 25.0000 ug | INTRAMUSCULAR | Status: DC | PRN

## 2019-09-27 MED ORDER — GUAIFENESIN-DM 100-10 MG/5ML PO SYRP: 15.0000 mL | ORAL_SOLUTION | ORAL | Status: DC | PRN

## 2019-09-27 MED ORDER — ONDANSETRON HCL 4 MG/2ML IJ SOLN
INTRAMUSCULAR | Status: AC
  Filled 2019-09-27: qty 2

## 2019-09-27 MED ORDER — PROPOFOL 10 MG/ML IV BOLUS
INTRAVENOUS | Status: DC | PRN
  Administered 2019-09-27: 170 mg via INTRAVENOUS

## 2019-09-27 MED ORDER — ONDANSETRON HCL 4 MG/2ML IJ SOLN
INTRAMUSCULAR | Status: DC | PRN
  Administered 2019-09-27: 4 mg via INTRAVENOUS

## 2019-09-27 MED ORDER — CEFAZOLIN SODIUM-DEXTROSE 2-4 GM/100ML-% IV SOLN
2.0000 g | Freq: Three times a day (TID) | INTRAVENOUS | Status: AC
  Administered 2019-09-27 (×2): 2 g via INTRAVENOUS
  Filled 2019-09-27 (×2): qty 100

## 2019-09-27 MED ORDER — LACTATED RINGERS IV SOLN: INTRAVENOUS | Status: DC | PRN

## 2019-09-27 MED ORDER — SODIUM CHLORIDE 0.9 % IV BOLUS
500.0000 mL | Freq: Once | INTRAVENOUS | Status: AC
  Administered 2019-09-27: 500 mL via INTRAVENOUS

## 2019-09-27 SURGICAL SUPPLY — 74 items
BAG BANDED W/RUBBER/TAPE 36X54 (MISCELLANEOUS) ×3 IMPLANT
BALLN STERLING RX 4X30X80 (BALLOONS) ×3
BALLN STERLING RX 5X20X80 (BALLOONS) ×3
BALLOON STERLING RX 4X30X80 (BALLOONS) IMPLANT
BALLOON STERLING RX 5X20X80 (BALLOONS) IMPLANT
CANISTER SUCT 3000ML PPV (MISCELLANEOUS) ×3 IMPLANT
CATH ROBINSON RED A/P 18FR (CATHETERS) IMPLANT
CLIP VESOCCLUDE MED 6/CT (CLIP) ×3 IMPLANT
CLIP VESOCCLUDE SM WIDE 6/CT (CLIP) ×3 IMPLANT
COVER DOME SNAP 22 D (MISCELLANEOUS) ×3 IMPLANT
COVER PROBE W GEL 5X96 (DRAPES) ×3 IMPLANT
COVER WAND RF STERILE (DRAPES) ×2 IMPLANT
DERMABOND ADHESIVE PROPEN (GAUZE/BANDAGES/DRESSINGS) ×2
DERMABOND ADVANCED (GAUZE/BANDAGES/DRESSINGS) ×1
DERMABOND ADVANCED .7 DNX12 (GAUZE/BANDAGES/DRESSINGS) ×2 IMPLANT
DERMABOND ADVANCED .7 DNX6 (GAUZE/BANDAGES/DRESSINGS) IMPLANT
DRAIN CHANNEL 15F RND FF W/TCR (WOUND CARE) IMPLANT
DRAPE FEMORAL ANGIO 80X135IN (DRAPES) ×3 IMPLANT
DRAPE INCISE IOBAN 66X45 STRL (DRAPES) ×1 IMPLANT
ELECT REM PT RETURN 9FT ADLT (ELECTROSURGICAL) ×3
ELECTRODE REM PT RTRN 9FT ADLT (ELECTROSURGICAL) ×2 IMPLANT
EVACUATOR SILICONE 100CC (DRAIN) IMPLANT
GLOVE BIO SURGEON STRL SZ7.5 (GLOVE) ×3 IMPLANT
GOWN STRL REUS W/ TWL LRG LVL3 (GOWN DISPOSABLE) ×4 IMPLANT
GOWN STRL REUS W/ TWL XL LVL3 (GOWN DISPOSABLE) ×2 IMPLANT
GOWN STRL REUS W/TWL LRG LVL3 (GOWN DISPOSABLE) ×2
GOWN STRL REUS W/TWL XL LVL3 (GOWN DISPOSABLE) ×1
GUIDEWIRE ENROUTE 0.014 (WIRE) ×3 IMPLANT
HEMOSTAT SNOW SURGICEL 2X4 (HEMOSTASIS) IMPLANT
INSERT FOGARTY SM (MISCELLANEOUS) IMPLANT
INTRODUCER KIT GALT 7CM (INTRODUCER) ×1
IV ADAPTER SYR DOUBLE MALE LL (MISCELLANEOUS) IMPLANT
KIT BASIN OR (CUSTOM PROCEDURE TRAY) ×3 IMPLANT
KIT ENCORE 26 ADVANTAGE (KITS) ×3 IMPLANT
KIT INTRODUCER GALT 7 (INTRODUCER) ×2 IMPLANT
KIT TURNOVER KIT B (KITS) ×3 IMPLANT
NDL HYPO 25GX1X1/2 BEV (NEEDLE) IMPLANT
NDL PERC 18GX7CM (NEEDLE) ×2 IMPLANT
NDL SPNL 20GX3.5 QUINCKE YW (NEEDLE) IMPLANT
NEEDLE HYPO 25GX1X1/2 BEV (NEEDLE) IMPLANT
NEEDLE PERC 18GX7CM (NEEDLE) ×3 IMPLANT
NEEDLE SPNL 20GX3.5 QUINCKE YW (NEEDLE) IMPLANT
PACK CAROTID (CUSTOM PROCEDURE TRAY) ×3 IMPLANT
PAD ARMBOARD 7.5X6 YLW CONV (MISCELLANEOUS) ×6 IMPLANT
POSITIONER HEAD DONUT 9IN (MISCELLANEOUS) ×3 IMPLANT
SET MICROPUNCTURE 5F STIFF (MISCELLANEOUS) IMPLANT
SHEATH AVANTI 11CM 5FR (SHEATH) IMPLANT
SHUNT CAROTID BYPASS 10 (VASCULAR PRODUCTS) IMPLANT
SHUNT CAROTID BYPASS 12FRX15.5 (VASCULAR PRODUCTS) IMPLANT
STENT TRANSCAROTID SYS 6X40 (Permanent Stent) ×1 IMPLANT
STOPCOCK 4 WAY LG BORE MALE ST (IV SETS) ×3 IMPLANT
SUT ETHILON 3 0 PS 1 (SUTURE) IMPLANT
SUT MNCRL AB 4-0 PS2 18 (SUTURE) ×3 IMPLANT
SUT PROLENE 5 0 C 1 24 (SUTURE) ×3 IMPLANT
SUT PROLENE 6 0 BV (SUTURE) ×1 IMPLANT
SUT PROLENE 7 0 BV 1 (SUTURE) IMPLANT
SUT SILK 2 0 PERMA HAND 18 BK (SUTURE) ×2 IMPLANT
SUT SILK 2 0 SH CR/8 (SUTURE) ×3 IMPLANT
SUT SILK 3 0 (SUTURE)
SUT SILK 3-0 18XBRD TIE 12 (SUTURE) IMPLANT
SUT VIC AB 3-0 SH 27 (SUTURE) ×1
SUT VIC AB 3-0 SH 27X BRD (SUTURE) ×2 IMPLANT
SYR 10ML LL (SYRINGE) ×9 IMPLANT
SYR 20ML LL LF (SYRINGE) ×3 IMPLANT
SYR 5ML LL (SYRINGE) ×3 IMPLANT
SYR CONTROL 10ML LL (SYRINGE) IMPLANT
SYSTEM TRANSCAROTID NEUROPRTCT (MISCELLANEOUS) ×2 IMPLANT
TOWEL GREEN STERILE (TOWEL DISPOSABLE) ×3 IMPLANT
TRANSCAROTID NEUROPROTECT SYS (MISCELLANEOUS) ×3
TUBING ART PRESS 48 MALE/FEM (TUBING) IMPLANT
TUBING EXTENTION W/L.L. (IV SETS) IMPLANT
WATER STERILE IRR 1000ML POUR (IV SOLUTION) ×3 IMPLANT
WIRE AMPLATZ SS-J .035X180CM (WIRE) IMPLANT
WIRE BENTSON .035X145CM (WIRE) ×3 IMPLANT

## 2019-09-27 NOTE — Anesthesia Postprocedure Evaluation (Signed)
Anesthesia Post Note  Patient: Peter Jordan  Procedure(s) Performed: RIGHT TRANSCAROTID ARTERY REVASCULARIZATION (Right Neck) ULTRASOUND GUIDANCE FOR VASCULAR ACCESS OF LEFT FEMORAL ARTERY (Left Groin)     Anesthesia Type: General Level of consciousness: awake Pain management: pain level controlled Vital Signs Assessment: post-procedure vital signs reviewed and stable Respiratory status: spontaneous breathing Cardiovascular status: stable Postop Assessment: no apparent nausea or vomiting Anesthetic complications: no   No complications documented.  Last Vitals:  Vitals:   09/27/19 1057 09/27/19 1100  BP:    Pulse: 64 66  Resp: 15 15  Temp: 36.6 C   SpO2: 96% 97%    Last Pain:  Vitals:   09/27/19 1057  TempSrc:   PainSc: 0-No pain                 [place]

## 2019-09-27 NOTE — Anesthesia Procedure Notes (Addendum)
Procedure Name: Intubation Date/Time: 09/27/2019 7:45 AM Performed by: Lance Coon, CRNA Pre-anesthesia Checklist: Patient identified, Emergency Drugs available, Patient being monitored and Suction available Patient Re-evaluated:Patient Re-evaluated prior to induction Oxygen Delivery Method: Circle system utilized Preoxygenation: Pre-oxygenation with 100% oxygen Induction Type: IV induction Ventilation: Oral airway inserted - appropriate to patient size and Mask ventilation without difficulty Laryngoscope Size: Glidescope and 4 Grade View: Grade II Tube type: Oral Tube size: 7.5 mm Number of attempts: 2 Airway Equipment and Method: Stylet and Video-laryngoscopy Placement Confirmation: ETT inserted through vocal cords under direct vision,  positive ETCO2 and breath sounds checked- equal and bilateral Secured at: 21 cm Tube secured with: Tape Dental Injury: Teeth and Oropharynx as per pre-operative assessment  Difficulty Due To: Difficult Airway- due to anterior larynx, Difficult Airway- due to reduced neck mobility and Difficulty was anticipated Future Recommendations: Recommend- induction with short-acting agent, and alternative techniques readily available

## 2019-09-27 NOTE — Op Note (Signed)
Patient name: Peter Jordan MRN: [id number] DOB: [birth year] Sex: male  09/27/2019 Pre-operative Diagnosis: symptomatic right carotid stenosis Post-operative diagnosis:  Same Surgeon:  Erlene Quan C. Donzetta Matters, MD Assistant: Annamarie Major, MD Procedure Performed:  Right transcarotid artery stent with 6 x 39mm Enroute stent with flow reversal embolic protection  Indications: 64 year old male with history of right neck radiation for cancer now has symptomatic high-grade heavily calcified lesion in his right ICA which appears to be a tandem lesion at the carotid bifurcation and also 20 mm cephalad.  He is indicated for transcarotid artery stenting.  An assistant was necessary to expedite this case with suction, retraction, assistance with catheter and stent placement.  Findings: The right internal carotid artery was heavily calcified and was greater than 90% stenosed at the origin with a tandem lesion greater than 50% stenosed 2 cm cephalad to this.  After stenting the most cephalad lesion was resolved and the more proximal lesion was less than 30% residual stenosis after postdilatation with 5 mm balloon.  Patient was neurologically intact upon awakening from anesthesia.   Procedure:  The patient was identified in the holding area and taken to the operating where is placed supine on the operative when general anesthesia was induced.  He was sterilely prepped and draped in the bilateral neck chest and bilateral groin areas.  Antibiotics were administered and timeout was called.  We used ultrasound to identify his common carotid artery where he had previously been irradiated.  Longitudinal incision was made extending down over the clavicle approximately 2 and half centimeters in length.  Concomitantly ultrasound was used to identify the left common femoral vein.  This was cannulated with needle followed by wire and an 8 French sheath was placed for flow reversal this was sutured to the skin.  We proceeded with  right common carotid artery dissection.  We dissected down identified the anterior border of the vessel placed in umbilical tape and a vessel loop around this.  Patient was fully heparinized ACT returned greater than 290.  5-0 Prolene suture was placed in figure-of-eight fashion in the anterior wall of the common carotid artery.  This was then cannulated with micropuncture needle followed by wire to 3 cm and sheath 3 cm.  Angiography was performed in a single anterior view.  We then placed our 8 French flow reversal sheath connected to flow reversal and confirmed flow reversal.  Sheath was affixed to the skin in 2 separate areas with silk suture.  We then performed angiography and anterior view as well as a 40 degree RAO plane which demonstrated no dissection in the common carotid artery.  We then prepared our predilatation balloon which is 4 x 30 mm with wire.  A time out was performed.  ACT was confirmed.  The common carotid artery was clamped.  Flow reversal was again confirmed.  We were able to traverse the wire into the intracranial portion of the ICA under fluoroscopic guidance.  We then predilated the lesion with 4 x 30 mm balloon.  We primarily stented with a 6 x 40.  In an anterior plane there was persistent stenosis this was postdilated with 5 x 20 mm balloon.  Completion demonstrated less than 30% residual stenosis anteriorly laterally also.  Patent.  The wire was removed clamp was removed.  50 mg of protamine was administered.  We obtain hemostasis and irrigated the neck wound and closed in layers with Vicryl and Monocryl.  Sheath was pulled from the left groin and pressure  was held till hemostasis obtained.  Patient was awakened from anesthesia having tolerated procedure without any complication.  All counts were correct at completion.  Patient was transferred to the recovery area and was neurologically at his baseline.  EBL: 50 cc   Alysiah Suppa C. Donzetta Matters, MD [facility]  [place] Office: [phone] Pager: [phone]

## 2019-09-27 NOTE — Progress Notes (Signed)
Progress Note    Peter Jordan  BUL:[id number] DOB: April 21, 1955  DOA: 09/24/2019 PCP: Patient, No Pcp Per    Brief Narrative:     Medical records reviewed and are as summarized below:  Peter Jordan is an 64 y.o. male with a history of tongue cancer status post radiation, CAD status post stent placement.  Patient presented secondary to transient right eye vision loss.MRA and CUS showed right carotid high grade stenosis.  Plan for surgery in the AM.  Assessment/Plan:   Principal Problem:   Amaurosis fugax Active Problems:   H/O tongue cancer   Mild renal insufficiency   Hyperlipidemia   Prediabetes   Internal carotid artery stenosis, right   Amaurosis fugax Neurology consulted on admission.  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  No evidence of stroke on MRI brain.  LDL of 171.  Hemoglobin A1c of 5.9%.  Patient started on aspirin  and Plavix 75 mg in addition to atorvastatin 80 mg daily. -Vascular surgery consult appreciated: s/p right trans-carotid artery stent  -Continue aspirin 81 mg and Plavix 75 mg daily -PT/OT d/c'd by neurology Dr. Curly Shores  Hyperlipidemia -LDL : 171 -Continue Lipitor 80 mg daily  Renal insufficiency Mild and unknown baseline.  -Cr stable as of yesterday  Prediabetes Patient's hemoglobin A1c is 5.9%.   -outpatient follow up  History of tongue cancer  S/p radiation therapy.    Family Communication/Anticipated D/C date and plan/Code Status   DVT prophylaxis: Lovenox ordered. Code Status: Full Code.  Family Communication: wife at bedside 9/8 Disposition Plan: Status is: Inpatient  Remains inpatient appropriate because:Inpatient level of care appropriate due to severity of illness   Dispo: The patient is from: Home              Anticipated d/c is to: Home              Anticipated d/c date is: 3 days              Patient currently is not medically stable to d/c.         Medical Consultants:      Neurology  Vascular     Subjective:   In surgery  Objective:    Vitals:   09/27/19 1117 09/27/19 1120 09/27/19 1132 09/27/19 1147  BP: (!) 99/51 (!) 112/54 103/61 122/62  Pulse: 61 62 68 78  Resp: 13 13 12 12   Temp:    (!) 97.2 F (36.2 C)  TempSrc:      SpO2: 96% 97% 97% 97%  Weight:      Height:        Intake/Output Summary (Last 24 hours) at 09/27/2019 1213 Last data filed at 09/27/2019 1148 Gross per 24 hour  Intake 1500 ml  Output 20 ml  Net 1480 ml   Filed Weights   09/24/19 1225  Weight: 62.6 kg    Exam:  In surgery  Data Reviewed:   I have personally reviewed following labs and imaging studies:  Labs: Labs show the following:   Basic Metabolic Panel: Recent Labs  Lab 09/24/19 1258 09/24/19 1258 09/25/19 0706 09/26/19 1135  NA 139  --  140 136  K 4.0   < > 4.0 4.2  CL 104  --  103 101  CO2 24  --  26 22  GLUCOSE 85  --  87 107*  BUN 22  --  14 18  CREATININE 1.39*  --  1.20 1.22  CALCIUM 9.0  --  9.5 9.4   < > = values in this interval not displayed.   GFR Estimated Creatinine Clearance: 54.2 mL/min (by C-G formula based on SCr of 1.22 mg/dL). Liver Function Tests: Recent Labs  Lab 09/24/19 1258  AST 17  ALT 14  ALKPHOS 70  BILITOT 0.4  PROT 7.2  ALBUMIN 3.6   No results for input(s): LIPASE, AMYLASE in the last 168 hours. No results for input(s): AMMONIA in the last 168 hours. Coagulation profile Recent Labs  Lab 09/24/19 1258  INR 1.0    CBC: Recent Labs  Lab 09/24/19 1258 09/25/19 0706 09/26/19 1135  WBC 13.3* 7.3 10.4  NEUTROABS 10.2*  --   --   HGB 13.8 15.7 14.4  HCT 42.2 47.5 43.0  MCV 97.5 96.0 95.6  PLT 317 328 328   Cardiac Enzymes: No results for input(s): CKTOTAL, CKMB, CKMBINDEX, TROPONINI in the last 168 hours. BNP (last 3 results) No results for input(s): PROBNP in the last 8760 hours. CBG: Recent Labs  Lab 09/24/19 1229  GLUCAP 116*   D-Dimer: No results for input(s): DDIMER in  the last 72 hours. Hgb A1c: Recent Labs    09/25/19 0706  HGBA1C 5.9*   Lipid Profile: Recent Labs    09/25/19 0706  CHOL 265*  HDL 65  LDLCALC 171*  TRIG 146  CHOLHDL 4.1   Thyroid function studies: No results for input(s): TSH, T4TOTAL, T3FREE, THYROIDAB in the last 72 hours.  Invalid input(s): FREET3 Anemia work up: No results for input(s): VITAMINB12, FOLATE, FERRITIN, TIBC, IRON, RETICCTPCT in the last 72 hours. Sepsis Labs: Recent Labs  Lab 09/24/19 1258 09/25/19 0706 09/26/19 1135  WBC 13.3* 7.3 10.4    Microbiology Recent Results (from the past 240 hour(s))  SARS Coronavirus 2 by RT PCR (hospital order, performed in [facility] hospital lab) Nasopharyngeal Nasopharyngeal Swab     Status: None   Collection Time: 09/24/19 12:58 PM   Specimen: Nasopharyngeal Swab  Result Value Ref Range Status   SARS Coronavirus 2 NEGATIVE NEGATIVE Final    Comment: (NOTE) SARS-CoV-2 target nucleic acids are NOT DETECTED.  The SARS-CoV-2 RNA is generally detectable in upper and lower respiratory specimens during the acute phase of infection. The lowest concentration of SARS-CoV-2 viral copies this assay can detect is 250 copies / mL. A negative result does not preclude SARS-CoV-2 infection and should not be used as the sole basis for treatment or other patient management decisions.  A negative result may occur with improper specimen collection / handling, submission of specimen other than nasopharyngeal swab, presence of viral mutation(s) within the areas targeted by this assay, and inadequate number of viral copies (<250 copies / mL). A negative result must be combined with clinical observations, patient history, and epidemiological information.  Fact Sheet for Patients:   StrictlyIdeas.no  Fact Sheet for Healthcare Providers: BankingDealers.co.za  This test is not yet approved or  cleared by the [place] FDA and has  been authorized for detection and/or diagnosis of SARS-CoV-2 by FDA under an Emergency Use Authorization (EUA).  This EUA will remain in effect (meaning this test can be used) for the duration of the COVID-19 declaration under Section 564(b)(1) of the Act, 21 U.S.C. section 360bbb-3(b)(1), unless the authorization is terminated or revoked sooner.  Performed at [facility], [address] [place], [place] [address]     Procedures and diagnostic studies:  CT ANGIO HEAD W OR WO CONTRAST  Result  Date: 09/25/2019 CLINICAL DATA:  Neuro deficit. EXAM: CT ANGIOGRAPHY HEAD AND NECK TECHNIQUE: Multidetector CT imaging of the head and neck was performed using the standard protocol during bolus administration of intravenous contrast. Multiplanar CT image reconstructions and MIPs were obtained to evaluate the vascular anatomy. Carotid stenosis measurements (when applicable) are obtained utilizing NASCET criteria, using the distal internal carotid diameter as the denominator. CONTRAST:  52mL ISOVUE-370 IOPAMIDOL (ISOVUE-370) INJECTION 76% COMPARISON:  09/24/2019 head CT. 09/24/2019 MRI head, MRA head and MRA neck. FINDINGS: CT HEAD FINDINGS Brain: No acute infarct or intracranial hemorrhage. No mass lesion. No midline shift, ventriculomegaly or extra-axial fluid collection. Cerebral volume within normal limits. Minimal chronic microvascular ischemic changes. Vascular: No hyperdense vessel. Bilateral skull base atherosclerotic calcifications. Skull: Negative for fracture or focal lesion. Sinuses/Orbits: Normal orbits. Small left maxillary sinus mucous retention cyst. No mastoid effusion. Other: None. Review of the MIP images confirms the above findings CTA NECK FINDINGS Aortic arch: Standard branching. Imaged portion shows no evidence of aneurysm or dissection. Mild calcified atheromatous plaque involving the aortic arch and great vessel origins. No significant stenosis of the major arch vessel origins.  Right carotid system: Carotid bifurcation atherosclerotic calcifications with approximately 80-90% narrowing of the carotid bulb and approximately 60-70% narrowing of the proximal right ICA. No evidence of dissection or aneurysm. Left carotid system: Noncalcified atheromatous plaque along the proximal left common carotid resulting in approximately 40% luminal narrowing. Multifocal plaque ulceration is also seen along the proximal left common carotid. Carotid bifurcation atherosclerotic calcifications with approximately 30% luminal narrowing the carotid bulb and proximal ICA. No evidence of dissection or aneurysm. Vertebral arteries: Codominant. No evidence of dissection or aneurysm. Left vertebral artery origin atherosclerotic calcifications with approximately 30% luminal narrowing. No high-grade narrowing involving the right V1 segment. Skeleton: Multilevel spondylosis. Other neck: No adenopathy.  No soft tissue mass. Upper chest: Emphysema. 6 mm right apical ground-glass nodule is unchanged. Partially imaged right upper lung reticular opacities. Review of the MIP images confirms the above findings CTA HEAD FINDINGS Anterior circulation: No high-grade stenosis, proximal occlusion, aneurysm, or vascular malformation. Right A1 segment hypoplasia. Bilateral carotid siphon atherosclerotic calcifications with mild narrowing of the intracavernous segments. Posterior circulation: No significant stenosis, proximal occlusion, aneurysm, or vascular malformation. Fetal origin of the right PCA. Venous sinuses: Patent. Anatomic variants: As detailed above. Review of the MIP images confirms the above findings IMPRESSION: 80-90% narrowing of the right carotid bulb and 60-70% narrowing of the proximal right ICA secondary to calcified atheromatous plaque. Proximal left common carotid atheromatous plaque with approximately 40% luminal narrowing. Multifocal plaque ulcerations are also seen along the proximal left common carotid  artery. 30% luminal narrowing of the left carotid bulb and proximal left ICA. Left vertebral artery origin atherosclerotic calcifications with approximately 30% luminal narrowing. Bilateral carotid siphon atherosclerotic calcifications with mild bilateral intracavernous ICA narrowing. Electronically Signed   By: Primitivo Gauze M.D.   On: 09/25/2019 14:42   CT ANGIO NECK W OR WO CONTRAST  Result Date: 09/25/2019 CLINICAL DATA:  Neuro deficit. EXAM: CT ANGIOGRAPHY HEAD AND NECK TECHNIQUE: Multidetector CT imaging of the head and neck was performed using the standard protocol during bolus administration of intravenous contrast. Multiplanar CT image reconstructions and MIPs were obtained to evaluate the vascular anatomy. Carotid stenosis measurements (when applicable) are obtained utilizing NASCET criteria, using the distal internal carotid diameter as the denominator. CONTRAST:  89mL ISOVUE-370 IOPAMIDOL (ISOVUE-370) INJECTION 76% COMPARISON:  09/24/2019 head CT. 09/24/2019 MRI head, MRA head and MRA  neck. FINDINGS: CT HEAD FINDINGS Brain: No acute infarct or intracranial hemorrhage. No mass lesion. No midline shift, ventriculomegaly or extra-axial fluid collection. Cerebral volume within normal limits. Minimal chronic microvascular ischemic changes. Vascular: No hyperdense vessel. Bilateral skull base atherosclerotic calcifications. Skull: Negative for fracture or focal lesion. Sinuses/Orbits: Normal orbits. Small left maxillary sinus mucous retention cyst. No mastoid effusion. Other: None. Review of the MIP images confirms the above findings CTA NECK FINDINGS Aortic arch: Standard branching. Imaged portion shows no evidence of aneurysm or dissection. Mild calcified atheromatous plaque involving the aortic arch and great vessel origins. No significant stenosis of the major arch vessel origins. Right carotid system: Carotid bifurcation atherosclerotic calcifications with approximately 80-90% narrowing of the  carotid bulb and approximately 60-70% narrowing of the proximal right ICA. No evidence of dissection or aneurysm. Left carotid system: Noncalcified atheromatous plaque along the proximal left common carotid resulting in approximately 40% luminal narrowing. Multifocal plaque ulceration is also seen along the proximal left common carotid. Carotid bifurcation atherosclerotic calcifications with approximately 30% luminal narrowing the carotid bulb and proximal ICA. No evidence of dissection or aneurysm. Vertebral arteries: Codominant. No evidence of dissection or aneurysm. Left vertebral artery origin atherosclerotic calcifications with approximately 30% luminal narrowing. No high-grade narrowing involving the right V1 segment. Skeleton: Multilevel spondylosis. Other neck: No adenopathy.  No soft tissue mass. Upper chest: Emphysema. 6 mm right apical ground-glass nodule is unchanged. Partially imaged right upper lung reticular opacities. Review of the MIP images confirms the above findings CTA HEAD FINDINGS Anterior circulation: No high-grade stenosis, proximal occlusion, aneurysm, or vascular malformation. Right A1 segment hypoplasia. Bilateral carotid siphon atherosclerotic calcifications with mild narrowing of the intracavernous segments. Posterior circulation: No significant stenosis, proximal occlusion, aneurysm, or vascular malformation. Fetal origin of the right PCA. Venous sinuses: Patent. Anatomic variants: As detailed above. Review of the MIP images confirms the above findings IMPRESSION: 80-90% narrowing of the right carotid bulb and 60-70% narrowing of the proximal right ICA secondary to calcified atheromatous plaque. Proximal left common carotid atheromatous plaque with approximately 40% luminal narrowing. Multifocal plaque ulcerations are also seen along the proximal left common carotid artery. 30% luminal narrowing of the left carotid bulb and proximal left ICA. Left vertebral artery origin  atherosclerotic calcifications with approximately 30% luminal narrowing. Bilateral carotid siphon atherosclerotic calcifications with mild bilateral intracavernous ICA narrowing. Electronically Signed   By: Primitivo Gauze M.D.   On: 09/25/2019 14:42   Structural Heart Procedure  Result Date: 09/27/2019 See surgical note for result.  HYBRID OR IMAGING ([facility] ONLY)  Result Date: 09/27/2019 There is no interpretation for this exam.  This order is for images obtained during a surgical procedure.  Please See "Surgeries" Tab for more information regarding the procedure.   HYBRID OR IMAGING ([facility] ONLY)  Result Date: 09/27/2019 There is no interpretation for this exam.  This order is for images obtained during a surgical procedure.  Please See "Surgeries" Tab for more information regarding the procedure.    Medications:   . aspirin  81 mg Oral Daily  . atorvastatin  80 mg Oral Daily  . clopidogrel  75 mg Oral Daily  . enoxaparin (LOVENOX) injection  40 mg Subcutaneous Q24H   Continuous Infusions:   LOS: 2 days   Geradine Girt  [facility]   How to contact the [facility] Attending or Consulting provider [place] or covering provider during after hours [place], for this patient?  1. Check the care team in [facility] and  look for a) attending/consulting [facility] provider listed and b) the [facility] team listed 2. Log into [email or web address] and use [place]'s universal password to access. If you do not have the password, please contact the hospital operator. 3. Locate the [facility] provider you are looking for under [facility] and page to a number that you can be directly reached. 4. If you still have difficulty reaching the provider, please page the [facility] (Director on Call) for the Hospitalists listed on amion for assistance.  09/27/2019, 12:13 PM

## 2019-09-27 NOTE — Progress Notes (Signed)
  Day of Surgery Note    Subjective:  No complaints. Wife at bedside.  He has dried blood on mouth and tongue.   Vitals:   09/27/19 1147 09/27/19 1218  BP: 122/62 123/76  Pulse: 78 74  Resp: 12 14  Temp: (!) 97.2 F (36.2 C) 97.9 F (36.6 C)  SpO2: 97% 97%    Incisions:   Right neck incision well approximated. No hematoma Extremities:  Moves all well. 5/5 hand grip strength HEENT: small hematoma noted of tip of tongue. No active bleeding Cardiac:  RRR Lungs:  nonlabored Neuro: A and O times 4. Tongue midline. Speech clear. Chronic hoarseness.   Assessment/Plan:  This is a 64 y.o. male who is s/p right TCAR secondary to symptomatic right ICA stenosis.  Hemodynamically stable. Neurologically intact.  Trauma to tongue likely from intubation.  Findings: The right internal carotid artery was heavily calcified and was greater than 90% stenosed at the origin with a tandem lesion greater than 50% stenosed 2 cm cephalad to this.  After stenting the most cephalad lesion was resolved and the more proximal lesion was less than 30% residual stenosis after postdilatation with 5 mm balloon.  Patient was neurologically intact upon awakening from anesthesia.              -Continue monitoring, aspirin, Plavix and statin   Risa Grill, PA-C 09/27/2019 12:30 PM [phone]

## 2019-09-27 NOTE — Progress Notes (Signed)
Patient's ring placed in ziploc bag and handed to patients wife- Peter Jordan.

## 2019-09-27 NOTE — OR Nursing (Signed)
Neuro  Check post op good.

## 2019-09-27 NOTE — Interval H&P Note (Signed)
History and Physical Interval Note:  09/27/2019 7:12 AM  Peter Jordan  has presented today for surgery, with the diagnosis of RIGHT CAROTID STENOSIS.  The various methods of treatment have been discussed with the patient and family. After consideration of risks, benefits and other options for treatment, the patient has consented to  Procedure(s): RIGHT TRANSCAROTID ARTERY REVASCULARIZATION (Right) as a surgical intervention.  The patient's history has been reviewed, patient examined, no change in status, stable for surgery.  I have reviewed the patient's chart and labs.  Questions were answered to the patient's satisfaction.     Servando Snare

## 2019-09-27 NOTE — Progress Notes (Signed)
Pt arrived to rm 16 from PACU. VSS. Initiated tele. CHG and assessment performed.   Lavenia Atlas, RN

## 2019-09-27 NOTE — Transfer of Care (Signed)
Immediate Anesthesia Transfer of Care Note  Patient: Peter Jordan  Procedure(s) Performed: RIGHT TRANSCAROTID ARTERY REVASCULARIZATION (Right Neck) ULTRASOUND GUIDANCE FOR VASCULAR ACCESS OF LEFT FEMORAL ARTERY (Left Groin)  Patient Location: PACU  Anesthesia Type:General  Level of Consciousness: drowsy and patient cooperative  Airway & Oxygen Therapy: Patient Spontanous Breathing  Post-op Assessment: Report given to RN and Post -op Vital signs reviewed and stable  Post vital signs: Reviewed and stable  Last Vitals:  Vitals Value Taken Time  BP 133/67 09/27/19 0932  Temp    Pulse 84 09/27/19 0935  Resp 13 09/27/19 0935  SpO2 97 % 09/27/19 0935  Vitals shown include unvalidated device data.  Last Pain:  Vitals:   09/27/19 0447  TempSrc: Oral  PainSc:          Complications: No complications documented.

## 2019-09-28 ENCOUNTER — Encounter (HOSPITAL_COMMUNITY): Payer: Self-pay | Admitting: Internal Medicine

## 2019-09-28 LAB — BASIC METABOLIC PANEL
Anion gap: 10 (ref 5–15)
BUN: 23 mg/dL (ref 8–23)
CO2: 23 mmol/L (ref 22–32)
Calcium: 9.1 mg/dL (ref 8.9–10.3)
Chloride: 106 mmol/L (ref 98–111)
Creatinine, Ser: 1.49 mg/dL — ABNORMAL HIGH (ref 0.61–1.24)
GFR calc Af Amer: 57 mL/min — ABNORMAL LOW (ref >60)
GFR calc non Af Amer: 49 mL/min — ABNORMAL LOW (ref >60)
Glucose, Bld: 104 mg/dL — ABNORMAL HIGH (ref 70–99)
Potassium: 4.4 mmol/L (ref 3.5–5.1)
Sodium: 139 mmol/L (ref 135–145)

## 2019-09-28 LAB — CBC
HCT: 32.7 % — ABNORMAL LOW (ref 39.0–52.0)
Hemoglobin: 10.9 g/dL — ABNORMAL LOW (ref 13.0–17.0)
MCH: 32 pg (ref 26.0–34.0)
MCHC: 33.3 g/dL (ref 30.0–36.0)
MCV: 95.9 fL (ref 80.0–100.0)
Platelets: 287 10*3/uL (ref 150–400)
RBC: 3.41 MIL/uL — ABNORMAL LOW (ref 4.22–5.81)
RDW: 13.2 % (ref 11.5–15.5)
WBC: 12.3 10*3/uL — ABNORMAL HIGH (ref 4.0–10.5)
nRBC: 0 % (ref 0.0–0.2)

## 2019-09-28 MED ORDER — ASPIRIN 81 MG PO CHEW: 81.0000 mg | CHEWABLE_TABLET | Freq: Every day | ORAL | Status: AC

## 2019-09-28 MED ORDER — HYDROCODONE-ACETAMINOPHEN 5-325 MG PO TABS
1.0000 | ORAL_TABLET | ORAL | 0 refills | Status: AC | PRN
Start: 2019-09-28 — End: 2020-09-27

## 2019-09-28 MED ORDER — ONDANSETRON HCL 4 MG/2ML IJ SOLN
4.0000 mg | Freq: Four times a day (QID) | INTRAMUSCULAR | Status: DC | PRN
  Administered 2019-09-28: 4 mg via INTRAVENOUS

## 2019-09-28 MED ORDER — CLOPIDOGREL BISULFATE 75 MG PO TABS: 75.0000 mg | ORAL_TABLET | Freq: Every day | ORAL | 0 refills | Status: AC

## 2019-09-28 MED ORDER — ATORVASTATIN CALCIUM 80 MG PO TABS: 80.0000 mg | ORAL_TABLET | Freq: Every day | ORAL | 0 refills | Status: AC

## 2019-09-28 NOTE — Progress Notes (Addendum)
BP 85/55 - 95/53 mmHg. Notified Dr.Cain. Order received for NSS bolus 500 ml x 2. Pt's asymptomatic.   After IV fluid bolus 1000 ml given , BP 112/55- 120/64 mmHg, HR 50s-60s, NSR on monitor. On room air SPO2 97-98%. Remained afebrile.   Pt had productive cough, choking after drinking his tea and Ensure. He presented gurgling sound in his throat. He also had nausea and vomiting, with the clear mixed blood tint emesis.  Zofran given. Symptoms relieved. Pain tolerated well with Tylenol and Percocet.  Incision on his neck and left groin dry and clean. No bleeding or hematoma.Continue to monitor.  Kennyth Lose, RN

## 2019-09-28 NOTE — Progress Notes (Signed)
Mobility Specialist: Progress Note    09/28/19 1318  Mobility  Activity Ambulated in hall  Level of Assistance Independent  Assistive Device None  Distance Ambulated (ft) 380 ft  Mobility Response Tolerated well  Mobility performed by Mobility specialist  $Mobility charge 1 Mobility   Post-Mobility: 87 HR, 95% SpO2  Pt had no c/o during ambulation.   [facility] Henya Aguallo Mobility Specialist

## 2019-09-28 NOTE — Evaluation (Addendum)
Clinical/Bedside Swallow Evaluation Patient Details  Name: Peter Jordan MRN: [id number] Date of Birth: 1955-09-12  Today's Date: 09/28/2019 Time: SLP Start Time (ACUTE ONLY): 0948 SLP Stop Time (ACUTE ONLY): 1018 SLP Time Calculation (min) (ACUTE ONLY): 30 min  Past Medical History:  Past Medical History:  Diagnosis Date  . Cancer [facility])    Past Surgical History:  Past Surgical History:  Procedure Laterality Date  . CORONARY ANGIOPLASTY WITH STENT PLACEMENT     HPI:  Pt is a 64 y.o. 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 Patient reported upon admission that he had been experiencing frequent episodes of right facial numbness that he attributed to prior neck surgeries in March, 2015. MRI brain was negative for acute changes. 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 Pt is s/p right transcarotid artery revascularization. Pt reported that he has had three modified barium swallow studies; at [facility], in [place], and in [place] with the most recent being two year prior at [facility]. Pt indicated that he was on thickened liquids after the most recent study at [facility] but has since changed his diet to thin liquids. Per the pt, he is able to swallow water with Dr. Malachi Bonds the easiest. Pt indicated that he typically consumes soft solids with avoidance of harder foods or crumbly meats (e.g., hamburger) with notably less difficulty.    Assessment / Plan / Recommendation Clinical Impression  Pt was seen for bedside swallow evaluation with his wife present. Both parties reported the pt having chronic dysphagia which becomes exacerbated when he has surgery or any acute medical illness and then resolves within 4-5 days. He stated that he typically starts with puree solids and advances himself back to soft  solids as he tolerates it during those times. Pt reported today that he is now having difficulty swallowing pills and that more swallows are needed for him to perceive pharyngeal clearance. Oral mechanism exam was significant for reduced facial strength and reduced lingual strength and ROM. Palpation of the pharyngeal musculature revealed firmness suggestive of fibrosis s/p radiation. He exhibited symptoms of pharyngeal dysphagia characterized by signs of aspiration with p.o. intake and symptoms of pharyngeal residue. Pt exhibited the most difficulty with mechanical soft solids. Pt's symptoms of pharyngeal residue were reduced with smaller bolus sizes, multiple swallows, and liquid washes. It is recommended that the pt's diet be downgraded to dysphagia 1 (puree) solids with thin liquids and meds crushed and given in puree.  Pt indicated that the plan is for him to be discharged today. Pt is independent with swallowing precautions and expressed that he would be comfortable being discharged on dysphagia 1 since this is typically what he does. If pt is not discharged, SLP will follow to ensure diet tolerance and to determine his ability to safely tolerate a more advanced diet. SLP Visit Diagnosis: Dysphagia, pharyngeal phase (R13.13)    Aspiration Risk  Moderate aspiration risk    Diet Recommendation Dysphagia 1 (Puree);Thin liquid   Liquid Administration via: Cup;Straw Medication Administration: Crushed with puree Supervision: Patient able to self feed Compensations: Small sips/bites;Slow rate;Follow solids with liquid;Multiple dry swallows after each bite/sip Postural Changes: Seated upright at 90 degrees;Remain upright for at least 30 minutes after po intake    Other  Recommendations Oral Care Recommendations: Oral care BID;Patient independent with oral care  Follow up Recommendations None      Frequency and Duration min 2x/week  2 weeks       Prognosis Prognosis for Safe Diet Advancement:  Good Barriers to Reach Goals: Severity of deficits;Time post onset      Swallow Study   General Date of Onset: 09/27/12 HPI: Pt is a 65 y.o. 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 Patient reported upon admission that he had been experiencing frequent episodes of right facial numbness that he attributed to prior neck surgeries in March, 2015. MRI brain was negative for acute changes. 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 Pt is s/p right transcarotid artery revascularization. Pt reported that he has had three modified barium swallow studies; at [facility], in [place], and in [place] with the most recent being two year prior at [facility]. Pt indicated that he was on thickened liquids after the most recent study at [facility] but has since changed his diet to thin liquids. Per the pt, he is able to swallow water with Dr. Malachi Bonds the easiest. Per the pt's wife, the pt had been consuming regular texture solids and thin liquids prior to admission with notably less difficulty.  Type of Study: Bedside Swallow Evaluation Previous Swallow Assessment: See HPI Diet Prior to this Study: Regular;Thin liquids Temperature Spikes Noted: No Respiratory Status: Room air History of Recent Intubation: No Behavior/Cognition: Alert;Cooperative;Pleasant mood Oral Cavity Assessment: Within Functional Limits Oral Care Completed by SLP: No Oral Cavity - Dentition: Dentures, top;Dentures, bottom Vision: Functional for self-feeding Self-Feeding Abilities: Able to feed self Patient Positioning: Upright in bed;Postural control adequate for testing Baseline Vocal Quality: Hoarse;Wet;Suspected CN X (Vagus) involvement;Low vocal intensity Volitional Cough: Weak Volitional Swallow: Able to elicit    Oral/Motor/Sensory Function  Overall Oral Motor/Sensory Function: Mild impairment Facial ROM: Reduced left;Suspected CN VII (facial) dysfunction Facial Symmetry: Within Functional Limits Facial Strength: Reduced left;Suspected CN VII (facial) dysfunction Facial Sensation: Within Functional Limits Lingual ROM: Reduced left;Reduced right;Suspected CN XII (hypoglossal) dysfunction Lingual Symmetry: Abnormal symmetry right;Abnormal symmetry left;Suspected CN XII (hypoglossal) dysfunction Lingual Strength: Reduced;Suspected CN XII (hypoglossal) dysfunction Mandible: Within Functional Limits   Ice Chips Ice chips: Impaired Pharyngeal Phase Impairments: Cough - Immediate;Multiple swallows   Thin Liquid Thin Liquid: Impaired Presentation: Cup;Straw Pharyngeal  Phase Impairments: Cough - Immediate    Nectar Thick Nectar Thick Liquid: Not tested   Honey Thick Honey Thick Liquid: Not tested   Puree Puree: Impaired Presentation: Spoon Pharyngeal Phase Impairments: Multiple swallows;Throat Clearing - Immediate;Cough - Delayed (with puree but not pudding)   Solid     Solid: Impaired Presentation: Spoon Pharyngeal Phase Impairments: Cough - Immediate;Cough - Delayed;Multiple swallows     Taber Sweetser I. Hardin Negus, [place], [place] Office number [phone] Pager [phone]  Horton Marshall 09/28/2019,11:30 AM

## 2019-09-28 NOTE — Progress Notes (Addendum)
Pt presented mind dysarthria and dysphagia. He had slurred speech, hoarse voice, difficulties to swallow pills and chocked after drinking thin liquid. Required pills to crush and administered with apple sauce. Consulted SLP for futher evaluation and treatment plan.     09/28/19 0338  Neurological  Neuro (WDL) X  Orientation Level Oriented X4  Cognition Follows commands;Appropriate for developmental age;Appropriate safety awareness;Appropriate judgement;Appropriate attention/concentration  Speech Slurred/Dysarthria  Pupil Assessment  Yes  R Pupil Size (mm) 3  R Pupil Shape Round  R Pupil Reaction Brisk  L Pupil Size (mm) 3  L Pupil Shape Round  L Pupil Reaction Brisk  Additional Pupil Assessments No  Motor Function/Sensation Assessment Grip;Head;Elbow extension;Elbow flexion;Pronator drift;Dorsiflexion;Plantar flexion;Arm ataxia;Leg ataxia;Motor response;Sensation;Motor strength  Facial Symmetry Asymmetrical right  R Hand Grip Strong  L Hand Grip Strong  R Elbow Extension (Push/Biceps) Strong  L Elbow Extension (Push/Biceps) Strong  R Elbow Flexion (Pull/Triceps) Strong  L Elbow Flexion (Pull/Triceps) Strong  Right Pronator Drift Absent  Left Pronator Drift Absent  R Foot Dorsiflexion Strong  L Foot Dorsiflexion Strong  R Foot Plantar Flexion Strong  L Foot Plantar Flexion Strong  R Finger to Nose (Point to [employer]) Smooth  L Finger to Nose (Point to [employer]) Smooth  R Heel to [employer] (Point to [employer]) Smooth  L Heel to [employer] to [employer]) Smooth  RUE Motor Response Purposeful movement  RUE Sensation Full sensation  RUE Motor Strength 5  LUE Motor Response Purposeful movement  LUE Sensation Full sensation  LUE Motor Strength 5  RLE Motor Response Purposeful movement  RLE Sensation Full sensation  RLE Motor Strength 5  LLE Motor Response Purposeful movement  LLE Sensation Full sensation  LLE Motor Strength 5  Neuro Symptoms Fatigue  Neuro symptoms relieved by Rest;Relaxation  techniques (Comment)  Glasgow Coma Scale  Eye Opening 4  Best Verbal Response (NON-intubated) 5  Best Motor Response 6  Glasgow Coma Scale Score 15  NIH Stroke Scale ( + Modified Stroke Scale Criteria)   Interval Other (Comment) (q 4hrs)  Level of Consciousness (1a.)    0  LOC Questions (1b. )   + 0  LOC Commands (1c. )   +  0  Best Gaze (2. )  + 0  Visual (3. )  + 0  Facial Palsy (4. )     1  Motor Arm, Left (5a. )   + 0  Motor Arm, Right (5b. )   + 0  Motor Leg, Left (6a. )   + 0  Motor Leg, Right (6b. )   + 0  Limb Ataxia (7. ) 0  Sensory (8. )   + 0  Best Language (9. )   + 0  Dysarthria (10. ) 1  Extinction/Inattention (11.)   + 0  Modified SS Total  + 0  Complete NIHSS TOTAL 2  Neurological  Level of Consciousness Alert    Kennyth Lose, RN

## 2019-09-28 NOTE — Discharge Summary (Signed)
Physician Discharge Summary  Peter Jordan QTM:[id number] DOB: [birth year] DOA: 09/24/2019  PCP: Patient, No Pcp Per  Admit date: 09/24/2019 Discharge date: 09/28/2019  Admitted From: Home Discharge disposition: *Home   Recommendations for Outpatient Follow-Up:   1. Outpatient vascular follow-up 2. Patient discharged home on aspirin Plavix and statin per vascular recommendations: Will defer length of treatment to vascular 3. BMP 1 week   Discharge Diagnosis:   Principal Problem:   Amaurosis fugax Active Problems:   H/O tongue cancer   Mild renal insufficiency   Hyperlipidemia   Prediabetes   Internal carotid artery stenosis, right    Discharge Condition: Improved.  Diet recommendation: Low sodium, heart healthy.  Wound care: None.  Code status: Full.   History of Present Illness:   Peter Jordan is a 64 y.o. male with medical history significant for tongue cancer, now presenting to the emergency department for evaluation of vision deficit.  Patient reports that he has been experiencing frequent episodes of right facial numbness that he attributes to prior neck surgeries but then had an episode today involving loss of right eye vision.  He describes loss of upper visual field with the right eye that lasted approximately 15 minutes before resolving.  He has had this previously, most recently 1 month ago, also involving the right eye and lasting 15 to 20 minutes.  He denies any chest pain or palpitations.  He denies any focal numbness or weakness other than the chronic right facial numbness.  He denies any headache.  Patient denies fevers, chills, cough, or shortness of breath.   Hospital Course by Problem:   Amaurosis fugax Neurology consulted on admission. 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 No evidence of stroke on MRI brain. LDL of 171. Hemoglobin A1c of 5.9%. Patient started on aspirin  and Plavix 75 mg in addition to  atorvastatin 80 mg daily. -Vascular surgery consult appreciated: s/p right trans-carotid artery stent  -Continue aspirin 81 mg and Plavix 75 mg daily Patient ambulatory so no need for PT  Hyperlipidemia -LDL : 171 -Continue Lipitor 80 mg daily  Renal insufficiency Mild and unknown baseline.  -Cr stable as of yesterday  Prediabetes Patient's hemoglobin A1c is 5.9%.  -outpatient follow up  History of tongue cancer  S/p radiation therapy. -Dysphagia diet    Medical Consultants:   Neurology Vascular   Discharge Exam:   Vitals:   09/28/19 0754 09/28/19 0805  BP: (!) 85/63 128/61  Pulse: 89   Resp: 17   Temp: 98.2 F (36.8 C)   SpO2: 96%    Vitals:   09/27/19 2344 09/28/19 0349 09/28/19 0754 09/28/19 0805  BP: 120/64 (!) 112/55 (!) 85/63 128/61  Pulse: 85 66 89   Resp: 18 13 17    Temp:  97.7 F (36.5 C) 98.2 F (36.8 C)   TempSrc:  Oral Oral   SpO2: 95% 96% 96%   Weight:      Height:        General exam: Appears calm and comfortable.   The results of significant diagnostics from this hospitalization (including imaging, microbiology, ancillary and laboratory) are listed below for reference.     Procedures and Diagnostic Studies:   CT ANGIO HEAD W OR WO CONTRAST  Result Date: 09/25/2019 CLINICAL DATA:  Neuro deficit. EXAM: CT ANGIOGRAPHY HEAD AND NECK TECHNIQUE: Multidetector CT imaging of the head and neck was performed using the standard protocol during bolus administration of intravenous contrast.  Multiplanar CT image reconstructions and MIPs were obtained to evaluate the vascular anatomy. Carotid stenosis measurements (when applicable) are obtained utilizing NASCET criteria, using the distal internal carotid diameter as the denominator. CONTRAST:  84mL ISOVUE-370 IOPAMIDOL (ISOVUE-370) INJECTION 76% COMPARISON:  09/24/2019 head CT. 09/24/2019 MRI head, MRA head and MRA neck. FINDINGS: CT HEAD FINDINGS Brain: No acute infarct or intracranial hemorrhage.  No mass lesion. No midline shift, ventriculomegaly or extra-axial fluid collection. Cerebral volume within normal limits. Minimal chronic microvascular ischemic changes. Vascular: No hyperdense vessel. Bilateral skull base atherosclerotic calcifications. Skull: Negative for fracture or focal lesion. Sinuses/Orbits: Normal orbits. Small left maxillary sinus mucous retention cyst. No mastoid effusion. Other: None. Review of the MIP images confirms the above findings CTA NECK FINDINGS Aortic arch: Standard branching. Imaged portion shows no evidence of aneurysm or dissection. Mild calcified atheromatous plaque involving the aortic arch and great vessel origins. No significant stenosis of the major arch vessel origins. Right carotid system: Carotid bifurcation atherosclerotic calcifications with approximately 80-90% narrowing of the carotid bulb and approximately 60-70% narrowing of the proximal right ICA. No evidence of dissection or aneurysm. Left carotid system: Noncalcified atheromatous plaque along the proximal left common carotid resulting in approximately 40% luminal narrowing. Multifocal plaque ulceration is also seen along the proximal left common carotid. Carotid bifurcation atherosclerotic calcifications with approximately 30% luminal narrowing the carotid bulb and proximal ICA. No evidence of dissection or aneurysm. Vertebral arteries: Codominant. No evidence of dissection or aneurysm. Left vertebral artery origin atherosclerotic calcifications with approximately 30% luminal narrowing. No high-grade narrowing involving the right V1 segment. Skeleton: Multilevel spondylosis. Other neck: No adenopathy.  No soft tissue mass. Upper chest: Emphysema. 6 mm right apical ground-glass nodule is unchanged. Partially imaged right upper lung reticular opacities. Review of the MIP images confirms the above findings CTA HEAD FINDINGS Anterior circulation: No high-grade stenosis, proximal occlusion, aneurysm, or vascular  malformation. Right A1 segment hypoplasia. Bilateral carotid siphon atherosclerotic calcifications with mild narrowing of the intracavernous segments. Posterior circulation: No significant stenosis, proximal occlusion, aneurysm, or vascular malformation. Fetal origin of the right PCA. Venous sinuses: Patent. Anatomic variants: As detailed above. Review of the MIP images confirms the above findings IMPRESSION: 80-90% narrowing of the right carotid bulb and 60-70% narrowing of the proximal right ICA secondary to calcified atheromatous plaque. Proximal left common carotid atheromatous plaque with approximately 40% luminal narrowing. Multifocal plaque ulcerations are also seen along the proximal left common carotid artery. 30% luminal narrowing of the left carotid bulb and proximal left ICA. Left vertebral artery origin atherosclerotic calcifications with approximately 30% luminal narrowing. Bilateral carotid siphon atherosclerotic calcifications with mild bilateral intracavernous ICA narrowing. Electronically Signed   By: Primitivo Gauze M.D.   On: 09/25/2019 14:42   CT ANGIO NECK W OR WO CONTRAST  Result Date: 09/25/2019 CLINICAL DATA:  Neuro deficit. EXAM: CT ANGIOGRAPHY HEAD AND NECK TECHNIQUE: Multidetector CT imaging of the head and neck was performed using the standard protocol during bolus administration of intravenous contrast. Multiplanar CT image reconstructions and MIPs were obtained to evaluate the vascular anatomy. Carotid stenosis measurements (when applicable) are obtained utilizing NASCET criteria, using the distal internal carotid diameter as the denominator. CONTRAST:  84mL ISOVUE-370 IOPAMIDOL (ISOVUE-370) INJECTION 76% COMPARISON:  09/24/2019 head CT. 09/24/2019 MRI head, MRA head and MRA neck. FINDINGS: CT HEAD FINDINGS Brain: No acute infarct or intracranial hemorrhage. No mass lesion. No midline shift, ventriculomegaly or extra-axial fluid collection. Cerebral volume within normal limits.  Minimal chronic microvascular ischemic changes.  Vascular: No hyperdense vessel. Bilateral skull base atherosclerotic calcifications. Skull: Negative for fracture or focal lesion. Sinuses/Orbits: Normal orbits. Small left maxillary sinus mucous retention cyst. No mastoid effusion. Other: None. Review of the MIP images confirms the above findings CTA NECK FINDINGS Aortic arch: Standard branching. Imaged portion shows no evidence of aneurysm or dissection. Mild calcified atheromatous plaque involving the aortic arch and great vessel origins. No significant stenosis of the major arch vessel origins. Right carotid system: Carotid bifurcation atherosclerotic calcifications with approximately 80-90% narrowing of the carotid bulb and approximately 60-70% narrowing of the proximal right ICA. No evidence of dissection or aneurysm. Left carotid system: Noncalcified atheromatous plaque along the proximal left common carotid resulting in approximately 40% luminal narrowing. Multifocal plaque ulceration is also seen along the proximal left common carotid. Carotid bifurcation atherosclerotic calcifications with approximately 30% luminal narrowing the carotid bulb and proximal ICA. No evidence of dissection or aneurysm. Vertebral arteries: Codominant. No evidence of dissection or aneurysm. Left vertebral artery origin atherosclerotic calcifications with approximately 30% luminal narrowing. No high-grade narrowing involving the right V1 segment. Skeleton: Multilevel spondylosis. Other neck: No adenopathy.  No soft tissue mass. Upper chest: Emphysema. 6 mm right apical ground-glass nodule is unchanged. Partially imaged right upper lung reticular opacities. Review of the MIP images confirms the above findings CTA HEAD FINDINGS Anterior circulation: No high-grade stenosis, proximal occlusion, aneurysm, or vascular malformation. Right A1 segment hypoplasia. Bilateral carotid siphon atherosclerotic calcifications with mild narrowing of  the intracavernous segments. Posterior circulation: No significant stenosis, proximal occlusion, aneurysm, or vascular malformation. Fetal origin of the right PCA. Venous sinuses: Patent. Anatomic variants: As detailed above. Review of the MIP images confirms the above findings IMPRESSION: 80-90% narrowing of the right carotid bulb and 60-70% narrowing of the proximal right ICA secondary to calcified atheromatous plaque. Proximal left common carotid atheromatous plaque with approximately 40% luminal narrowing. Multifocal plaque ulcerations are also seen along the proximal left common carotid artery. 30% luminal narrowing of the left carotid bulb and proximal left ICA. Left vertebral artery origin atherosclerotic calcifications with approximately 30% luminal narrowing. Bilateral carotid siphon atherosclerotic calcifications with mild bilateral intracavernous ICA narrowing. Electronically Signed   By: Primitivo Gauze M.D.   On: 09/25/2019 14:42   MR ANGIO HEAD WO CONTRAST  Result Date: 09/24/2019 CLINICAL DATA:  Right-sided facial numbness with vision changes. EXAM: MR HEAD WITHOUT CONTRAST MR CIRCLE OF WILLIS WITHOUT CONTRAST MRA OF THE NECK WITHOUT AND WITH CONTRAST TECHNIQUE: Multiplanar, multiecho pulse sequences of the brain, circle of willis and surrounding structures were obtained without intravenous contrast. Angiographic images of the neck were obtained using MRA technique without and with intravenous contrast. CONTRAST:  48mL GADAVIST GADOBUTROL 1 MMOL/ML IV SOLN COMPARISON:  None. FINDINGS: MR HEAD FINDINGS Brain: No acute infarct, acute hemorrhage or extra-axial collection. Multifocal hyperintense T2-weighted signal within the white matter. Normal volume of CSF spaces. Single chronic microhemorrhage from the left cerebellum. Normal midline structures. Vascular: Normal flow voids. Skull and upper cervical spine: Normal marrow signal. Sinuses/Orbits: Negative. Other: None. MR CIRCLE OF WILLIS FINDINGS  POSTERIOR CIRCULATION: --Vertebral arteries: Normal V4 segments. --Inferior cerebellar arteries: Normal. --Basilar artery: Normal. --Superior cerebellar arteries: Normal. --Posterior cerebral arteries: Normal. ANTERIOR CIRCULATION: --Intracranial internal carotid arteries: Normal. --Anterior cerebral arteries (ACA): Normal. Both A1 segments are present. Patent anterior communicating artery (a-comm). --Middle cerebral arteries (MCA): Normal. MRA NECK FINDINGS There is severe stenosis of the proximal right internal carotid artery (series 1096, image 165). The remainder of the ICA is normal.  The left carotid system is normal. Vertebral system is codominant. There stenosis of the left vertebral artery origin. Otherwise the vertebral arteries normal. IMPRESSION: 1. No acute intracranial abnormality. 2. Normal intracranial MRA. 3. 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 4. Left vertebral artery origin narrowing. Electronically Signed   By: Ulyses Jarred M.D.   On: 09/24/2019 22:04   MR ANGIO NECK W WO CONTRAST  Result Date: 09/24/2019 CLINICAL DATA:  Right-sided facial numbness with vision changes. EXAM: MR HEAD WITHOUT CONTRAST MR CIRCLE OF WILLIS WITHOUT CONTRAST MRA OF THE NECK WITHOUT AND WITH CONTRAST TECHNIQUE: Multiplanar, multiecho pulse sequences of the brain, circle of willis and surrounding structures were obtained without intravenous contrast. Angiographic images of the neck were obtained using MRA technique without and with intravenous contrast. CONTRAST:  63mL GADAVIST GADOBUTROL 1 MMOL/ML IV SOLN COMPARISON:  None. FINDINGS: MR HEAD FINDINGS Brain: No acute infarct, acute hemorrhage or extra-axial collection. Multifocal hyperintense T2-weighted signal within the white matter. Normal volume of CSF spaces. Single chronic microhemorrhage from the left cerebellum. Normal midline structures. Vascular: Normal flow voids. Skull and upper cervical spine: Normal marrow signal.  Sinuses/Orbits: Negative. Other: None. MR CIRCLE OF WILLIS FINDINGS POSTERIOR CIRCULATION: --Vertebral arteries: Normal V4 segments. --Inferior cerebellar arteries: Normal. --Basilar artery: Normal. --Superior cerebellar arteries: Normal. --Posterior cerebral arteries: Normal. ANTERIOR CIRCULATION: --Intracranial internal carotid arteries: Normal. --Anterior cerebral arteries (ACA): Normal. Both A1 segments are present. Patent anterior communicating artery (a-comm). --Middle cerebral arteries (MCA): Normal. MRA NECK FINDINGS There is severe stenosis of the proximal right internal carotid artery (series 1096, image 165). The remainder of the ICA is normal. The left carotid system is normal. Vertebral system is codominant. There stenosis of the left vertebral artery origin. Otherwise the vertebral arteries normal. IMPRESSION: 1. No acute intracranial abnormality. 2. Normal intracranial MRA. 3. 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 4. Left vertebral artery origin narrowing. Electronically Signed   By: Ulyses Jarred M.D.   On: 09/24/2019 22:04   MR BRAIN WO CONTRAST  Result Date: 09/24/2019 CLINICAL DATA:  Right-sided facial numbness with vision changes. EXAM: MR HEAD WITHOUT CONTRAST MR CIRCLE OF WILLIS WITHOUT CONTRAST MRA OF THE NECK WITHOUT AND WITH CONTRAST TECHNIQUE: Multiplanar, multiecho pulse sequences of the brain, circle of willis and surrounding structures were obtained without intravenous contrast. Angiographic images of the neck were obtained using MRA technique without and with intravenous contrast. CONTRAST:  48mL GADAVIST GADOBUTROL 1 MMOL/ML IV SOLN COMPARISON:  None. FINDINGS: MR HEAD FINDINGS Brain: No acute infarct, acute hemorrhage or extra-axial collection. Multifocal hyperintense T2-weighted signal within the white matter. Normal volume of CSF spaces. Single chronic microhemorrhage from the left cerebellum. Normal midline structures. Vascular: Normal flow voids. Skull  and upper cervical spine: Normal marrow signal. Sinuses/Orbits: Negative. Other: None. MR CIRCLE OF WILLIS FINDINGS POSTERIOR CIRCULATION: --Vertebral arteries: Normal V4 segments. --Inferior cerebellar arteries: Normal. --Basilar artery: Normal. --Superior cerebellar arteries: Normal. --Posterior cerebral arteries: Normal. ANTERIOR CIRCULATION: --Intracranial internal carotid arteries: Normal. --Anterior cerebral arteries (ACA): Normal. Both A1 segments are present. Patent anterior communicating artery (a-comm). --Middle cerebral arteries (MCA): Normal. MRA NECK FINDINGS There is severe stenosis of the proximal right internal carotid artery (series 1096, image 165). The remainder of the ICA is normal. The left carotid system is normal. Vertebral system is codominant. There stenosis of the left vertebral artery origin. Otherwise the vertebral arteries normal. IMPRESSION: 1. No acute intracranial abnormality. 2. Normal intracranial MRA. 3. Severe stenosis of the proximal right  internal carotid artery. 4. Left vertebral artery origin narrowing. Electronically Signed   By: Ulyses Jarred M.D.   On: 09/24/2019 22:04   ECHOCARDIOGRAM COMPLETE  Result Date: 09/25/2019    ECHOCARDIOGRAM REPORT   Patient Name:   Peter Jordan Date of Exam: 09/25/2019 Medical Rec #:  [id number]     Height:       67.0 in Accession #:    [id number]    Weight:       138.0 lb Date of Birth:  22-Feb-1955      BSA:          1.727 m Patient Age:    64 years      BP:           121/67 mmHg Patient Gender: M             HR:           72 bpm. Exam Location:  Inpatient Procedure: 2D Echo Indications:    TIA 435.9  History:        Patient has no prior history of Echocardiogram examinations.                 CAD.  Sonographer:    Johny Chess Referring Phys: [id number] [place]  1. Left ventricular ejection fraction, by estimation, is 45 to 50%. The left ventricle has mildly decreased function. The left ventricle demonstrates global  hypokinesis. Left ventricular diastolic parameters are consistent with Grade I diastolic dysfunction (impaired relaxation).  2. Right ventricular systolic function is normal. The right ventricular size is normal. There is normal pulmonary artery systolic pressure. The estimated right ventricular systolic pressure is 51.7 mmHg.  3. The mitral valve is normal in structure. Mild mitral valve regurgitation. No evidence of mitral stenosis.  4. The aortic valve is normal in structure. Aortic valve regurgitation is not visualized. No aortic stenosis is present.  5. The inferior vena cava is normal in size with greater than 50% respiratory variability, suggesting right atrial pressure of 3 mmHg. FINDINGS  Left Ventricle: Left ventricular ejection fraction, by estimation, is 45 to 50%. The left ventricle has mildly decreased function. The left ventricle demonstrates global hypokinesis. The left ventricular internal cavity size was normal in size. There is  no left ventricular hypertrophy. Left ventricular diastolic parameters are consistent with Grade I diastolic dysfunction (impaired relaxation). Normal left ventricular filling pressure. Right Ventricle: The right ventricular size is normal. No increase in right ventricular wall thickness. Right ventricular systolic function is normal. There is normal pulmonary artery systolic pressure. The tricuspid regurgitant velocity is 2.28 m/s, and  with an assumed right atrial pressure of 3 mmHg, the estimated right ventricular systolic pressure is 61.6 mmHg. Left Atrium: Left atrial size was normal in size. Right Atrium: Right atrial size was normal in size. Pericardium: There is no evidence of pericardial effusion. Mitral Valve: The mitral valve is normal in structure. There is mild thickening of the mitral valve leaflet(s). Normal mobility of the mitral valve leaflets. Mild mitral annular calcification. Mild mitral valve regurgitation. No evidence of mitral valve stenosis.  Tricuspid Valve: The tricuspid valve is normal in structure. Tricuspid valve regurgitation is trivial. No evidence of tricuspid stenosis. Aortic Valve: The aortic valve is normal in structure. Aortic valve regurgitation is not visualized. No aortic stenosis is present. Pulmonic Valve: The pulmonic valve was normal in structure. Pulmonic valve regurgitation is not visualized. No evidence of pulmonic stenosis. Aorta: The aortic root is normal in size  and structure. Venous: The inferior vena cava is normal in size with greater than 50% respiratory variability, suggesting right atrial pressure of 3 mmHg. IAS/Shunts: No atrial level shunt detected by color flow Doppler.  LEFT VENTRICLE PLAX 2D LVIDd:         5.20 cm  Diastology LVIDs:         4.00 cm  LV e' lateral:   7.51 cm/s LV PW:         0.90 cm  LV E/e' lateral: 7.1 LV IVS:        0.90 cm  LV e' medial:    7.07 cm/s LVOT diam:     2.20 cm  LV E/e' medial:  7.6 LV SV:         55 LV SV Index:   32 LVOT Area:     3.80 cm  RIGHT VENTRICLE             IVC RV S prime:     11.30 cm/s  IVC diam: 1.00 cm TAPSE (M-mode): 2.2 cm LEFT ATRIUM             Index       RIGHT ATRIUM           Index LA diam:        3.80 cm 2.20 cm/m  RA Area:     11.60 cm LA Vol (A2C):   40.2 ml 23.27 ml/m RA Volume:   26.30 ml  15.23 ml/m LA Vol (A4C):   39.2 ml 22.70 ml/m LA Biplane Vol: 41.4 ml 23.97 ml/m  AORTIC VALVE LVOT Vmax:   69.20 cm/s LVOT Vmean:  47.600 cm/s LVOT VTI:    0.144 m  AORTA Ao Root diam: 3.10 cm MITRAL VALVE               TRICUSPID VALVE MV Area (PHT): 3.48 cm    TR Peak grad:   20.8 mmHg MV Decel Time: 218 msec    TR Vmax:        228.00 cm/s MR PISA:        1.01 cm MR PISA Radius: 0.40 cm    SHUNTS MV E velocity: 53.60 cm/s  Systemic VTI:  0.14 m MV A velocity: 83.10 cm/s  Systemic Diam: 2.20 cm MV E/A ratio:  0.65 Fransico Him MD Electronically signed by Fransico Him MD Signature Date/Time: 09/25/2019/10:17:53 AM    Final    CT HEAD CODE STROKE WO  CONTRAST  Result Date: 09/24/2019 CLINICAL DATA:  Code stroke. Sudden onset numbness to the right-sided face beginning this morning EXAM: CT HEAD WITHOUT CONTRAST TECHNIQUE: Contiguous axial images were obtained from the base of the skull through the vertex without intravenous contrast. COMPARISON:  None. FINDINGS: Brain: No evidence of acute infarction, hemorrhage, hydrocephalus, or mass lesion/mass effect. Prominent CSF along cerebral convexities. There could be trace subdural hygroma on both sides towards the vertex, but no definite cortical vein mass effect. Vascular: No hyperdense vessel or unexpected calcification. Skull: Normal. Negative for fracture or focal lesion. Sinuses/Orbits: No acute finding. Other: These results were called by telephone at the time of interpretation on 09/24/2019 at 12:44 pm to provider [facility] , who verbally acknowledged these results. ASPECTS [facility] Stroke Program Early CT Score) - Ganglionic level infarction (caudate, lentiform nuclei, internal capsule, insula, M1-M3 cortex): 7 - Supraganglionic infarction (M4-M6 cortex): 3 Total score (0-10 with 10 being normal): 10 IMPRESSION: 1. Negative for hemorrhage or visible infarct.ASPECTS is 10. 2. Prominent CSF at  the convexities, normal variant versus trace subdural hygromas. Electronically Signed   By: Monte Fantasia M.D.   On: 09/24/2019 12:46   VAS US CAROTID  Result Date: 09/25/2019 Carotid Arterial Duplex Study Indications: Visual disturbance and stenosis on MRA. Limitations  Today's exam was limited due to the high bifurcation of the              carotid, heavy calcification and the resulting shadowing and              previous neck surgeries and scarring. Performing Technologist: Antonieta Pert RDMS, RVT  Examination Guidelines: A complete evaluation includes B-mode imaging, spectral Doppler, color Doppler, and power Doppler as needed of all accessible portions of each vessel. Bilateral testing is considered an integral  part of a complete examination. Limited examinations for reoccurring indications may be performed as noted.  Right Carotid Findings: +----------+--------+--------+--------+--------------------+------------------+           PSV cm/sEDV cm/sStenosisPlaque Description  Comments           +----------+--------+--------+--------+--------------------+------------------+ CCA Prox  72      19                                                     +----------+--------+--------+--------+--------------------+------------------+ CCA Mid                                               intimal thickening +----------+--------+--------+--------+--------------------+------------------+ CCA Distal106     29                                                     +----------+--------+--------+--------+--------------------+------------------+ ICA Prox  279     105     60-79%  diffuse and calcific                   +----------+--------+--------+--------+--------------------+------------------+ ICA Mid   125     36                                                     +----------+--------+--------+--------+--------------------+------------------+ ICA Distal66      27                                                     +----------+--------+--------+--------+--------------------+------------------+ ECA       209     48                                                     +----------+--------+--------+--------+--------------------+------------------+ +----------+--------+-------+------------+-------------------+           PSV cm/sEDV cmsDescribe    Arm Pressure (mmHG) +----------+--------+-------+------------+-------------------+  Subclavian               Not assessed                    +----------+--------+-------+------------+-------------------+ +---------+--------+--+--------+--+---------+ VertebralPSV cm/s38EDV cm/s12Antegrade +---------+--------+--+--------+--+---------+  heavy calcification could underestimate severity of stenosis Left Carotid Findings: +----------+--------+--------+--------+---------------------+------------------+           PSV cm/sEDV cm/sStenosisPlaque Description   Comments           +----------+--------+--------+--------+---------------------+------------------+ CCA Prox  114     39                                                      +----------+--------+--------+--------+---------------------+------------------+ CCA Mid   167     46                                   intimal thickening +----------+--------+--------+--------+---------------------+------------------+ CCA Distal179     57                                                      +----------+--------+--------+--------+---------------------+------------------+ ICA Prox  135     47      40-59%  diffuse, heterogenous                                                     and calcific                            +----------+--------+--------+--------+---------------------+------------------+ ICA Mid   154     52      40-59%                                          +----------+--------+--------+--------+---------------------+------------------+ ICA Distal110     36                                                      +----------+--------+--------+--------+---------------------+------------------+ ECA       121     27                                                      +----------+--------+--------+--------+---------------------+------------------+ +----------+--------+--------+--------+-------------------+           PSV cm/sEDV cm/sDescribeArm Pressure (mmHG) +----------+--------+--------+--------+-------------------+ Subclavian122                                         +----------+--------+--------+--------+-------------------+ +---------+--------+--+--------+--+---------+ VertebralPSV  cm/s65EDV cm/s24Antegrade  +---------+--------+--+--------+--+---------+   Summary: Right Carotid: Velocities in the right ICA are consistent with a 60-79%                stenosis. Difficult examination bilaterally due to plaque and                scaring. Heavy calcification near prox ICA could underestimate                severity of stenosis. Left Carotid: Velocities in the left ICA are consistent with a 40-59% stenosis. Vertebrals: Bilateral vertebral arteries demonstrate antegrade flow. *See table(s) above for measurements and observations.  Electronically signed by Deitra Mayo MD on 09/25/2019 at 4:07:38 PM.    Final      Labs:   Basic Metabolic Panel: Recent Labs  Lab 09/24/19 1258 09/24/19 1258 09/25/19 0706 09/25/19 0706 09/26/19 1135 09/26/19 1135 09/27/19 1344 09/28/19 0430  NA 139  --  140  --  136  --  138 139  K 4.0   < > 4.0   < > 4.2   < > 4.6 4.4  CL 104  --  103  --  101  --  102 106  CO2 24  --  26  --  22  --  23 23  GLUCOSE 85  --  87  --  107*  --  122* 104*  BUN 22  --  14  --  18  --  19 23  CREATININE 1.39*  --  1.20  --  1.22  --  1.42* 1.49*  CALCIUM 9.0  --  9.5  --  9.4  --  8.8* 9.1   < > = values in this interval not displayed.   GFR Estimated Creatinine Clearance: 44.3 mL/min (A) (by C-G formula based on SCr of 1.49 mg/dL (H)). Liver Function Tests: Recent Labs  Lab 09/24/19 1258  AST 17  ALT 14  ALKPHOS 70  BILITOT 0.4  PROT 7.2  ALBUMIN 3.6   No results for input(s): LIPASE, AMYLASE in the last 168 hours. No results for input(s): AMMONIA in the last 168 hours. Coagulation profile Recent Labs  Lab 09/24/19 1258  INR 1.0    CBC: Recent Labs  Lab 09/24/19 1258 09/25/19 0706 09/26/19 1135 09/27/19 1344 09/28/19 0430  WBC 13.3* 7.3 10.4 14.3* 12.3*  NEUTROABS 10.2*  --   --   --   --   HGB 13.8 15.7 14.4 12.9* 10.9*  HCT 42.2 47.5 43.0 38.3* 32.7*  MCV 97.5 96.0 95.6 96.0 95.9  PLT 317 328 328 311 287   Cardiac Enzymes: No results for input(s):  CKTOTAL, CKMB, CKMBINDEX, TROPONINI in the last 168 hours. BNP: Invalid input(s): POCBNP CBG: Recent Labs  Lab 09/24/19 1229  GLUCAP 116*   D-Dimer No results for input(s): DDIMER in the last 72 hours. Hgb A1c No results for input(s): HGBA1C in the last 72 hours. Lipid Profile No results for input(s): CHOL, HDL, LDLCALC, TRIG, CHOLHDL, LDLDIRECT in the last 72 hours. Thyroid function studies No results for input(s): TSH, T4TOTAL, T3FREE, THYROIDAB in the last 72 hours.  Invalid input(s): FREET3 Anemia work up No results for input(s): VITAMINB12, FOLATE, FERRITIN, TIBC, IRON, RETICCTPCT in the last 72 hours. Microbiology Recent Results (from the past 240 hour(s))  SARS Coronavirus 2 by RT PCR (hospital order, performed in [facility] hospital lab) Nasopharyngeal Nasopharyngeal Swab     Status: None   Collection Time: 09/24/19 12:58 PM  Specimen: Nasopharyngeal Swab  Result Value Ref Range Status   SARS Coronavirus 2 NEGATIVE NEGATIVE Final    Comment: (NOTE) SARS-CoV-2 target nucleic acids are NOT DETECTED.  The SARS-CoV-2 RNA is generally detectable in upper and lower respiratory specimens during the acute phase of infection. The lowest concentration of SARS-CoV-2 viral copies this assay can detect is 250 copies / mL. A negative result does not preclude SARS-CoV-2 infection and should not be used as the sole basis for treatment or other patient management decisions.  A negative result may occur with improper specimen collection / handling, submission of specimen other than nasopharyngeal swab, presence of viral mutation(s) within the areas targeted by this assay, and inadequate number of viral copies (<250 copies / mL). A negative result must be combined with clinical observations, patient history, and epidemiological information.  Fact Sheet for Patients:   StrictlyIdeas.no  Fact Sheet for Healthcare  Providers: BankingDealers.co.za  This test is not yet approved or  cleared by the [place] FDA and has been authorized for detection and/or diagnosis of SARS-CoV-2 by FDA under an Emergency Use Authorization (EUA).  This EUA will remain in effect (meaning this test can be used) for the duration of the COVID-19 declaration under Section 564(b)(1) of the Act, 21 U.S.C. section 360bbb-3(b)(1), unless the authorization is terminated or revoked sooner.  Performed at [facility], [address], [place], [place] [address]      Discharge Instructions:   Discharge Instructions    Ambulatory referral to Neurology   Complete by: As directed    Follow up with stroke clinic NP (Aliahna Statzer Vanschaick or Cecille Rubin, if both not available, consider Zachery Dauer, or [place]) at [facility] in about 4 weeks. Thanks.   Diet - low sodium heart healthy   Complete by: As directed    Increase activity slowly   Complete by: As directed    No wound care   Complete by: As directed      Allergies as of 09/28/2019      Reactions   Aspirin Other (See Comments)   Ibuprofen Other (See Comments)   Penicillins Rash      Medication List    TAKE these medications   acetaminophen 325 MG tablet Commonly known as: TYLENOL Take 650 mg by mouth every 6 (six) hours as needed for mild pain.   aspirin 81 MG chewable tablet Chew 1 tablet (81 mg total) by mouth daily. Start taking on: September 29, 2019   atorvastatin 80 MG tablet Commonly known as: LIPITOR Take 1 tablet (80 mg total) by mouth daily. Start taking on: September 29, 2019   clopidogrel 75 MG tablet Commonly known as: PLAVIX Take 1 tablet (75 mg total) by mouth daily. Start taking on: September 29, 2019   HYDROcodone-acetaminophen 5-325 MG tablet Commonly known as: NORCO/VICODIN Take 1 tablet by mouth every 4 (four) hours as needed for moderate pain.       Follow-up Information    [facility]. Schedule an appointment as soon as possible for a visit in 4 week(s).   Specialty: Neurology Contact information: [address] [place] [phone]       Waynetta Sandy, MD Follow up.   Specialties: Vascular Surgery, Cardiology Why: Office will call you to make follow-up appointment (sent) Contact information: [place] [address] [phone]                Time coordinating discharge: 32  min  Signed:  Geradine Girt DO  [facility] 09/28/2019, 12:26 PM

## 2019-09-28 NOTE — Discharge Instructions (Signed)
Dysphagia 1 (Puree);Thin liquid     Carotid Artery Disease  Carotid artery disease is the narrowing or blockage of one or both carotid arteries. This condition is also called carotid artery stenosis. The carotid arteries are the two main blood vessels on either side of the neck. They send blood to the brain, other parts of the head, and the neck.  This condition increases your risk for a stroke or a transient ischemic attack (TIA). A TIA is a "mini-stroke" that causes stroke-like symptoms that go away quickly. What are the causes? This condition is mainly caused by a narrowing and hardening of the carotid arteries. The carotid arteries can become narrow or clogged with a buildup of plaque. Plaque includes:  Fat.  Cholesterol.  Calcium.  Other substances. What increases the risk? The following factors may make you more likely to develop this condition:  Having certain medical conditions, such as: ? High cholesterol. ? High blood pressure. ? Diabetes. ? Obesity.  Smoking.  A family history of cardiovascular disease.  Not being active or lack of regular exercise.  Being male. Men have a higher risk of having arteries become narrow and harden earlier in life than women.  Old age. What are the signs or symptoms? This condition may not have any signs or symptoms until a stroke or TIA happens. In some cases, your doctor may be able to hear a whooshing sound. This can suggest a change in blood flow caused by plaque buildup. An eye exam can also help find signs of the condition. How is this treated? This condition may be treated with more than one treatment. Treatment options include:  Lifestyle changes, such as: ? Quitting smoking. ? Getting regular exercise, or getting exercise as told by your doctor. ? Eating a healthy diet. ? Managing stress. ? Keeping a healthy weight.  Medicines to control: ? Blood pressure. ? Cholesterol. ? Blood clotting.  Surgery. You may  have: ? A surgery to remove the blockages in the carotid arteries. ? A procedure in which a small mesh tube (stent) is used to widen the blocked carotid arteries. Follow these instructions at home: Eating and drinking Follow instructions about your diet from your doctor. It is important to follow a healthy diet.  Eat a diet that includes: ? A lot of fresh fruits and vegetables. ? Low-fat (lean) meats.  Avoid these foods: ? Foods that are high in fat. ? Foods that are high in salt (sodium). ? Foods that are fried. ? Foods that are processed. ? Foods that have few good nutrients (poor nutritional value).  Lifestyle   Keep a healthy weight.  Do exercises as told by your doctor to stay active. Each week, you should get one of the following: ? At least 150 minutes of exercise that raises your heart rate and makes you sweat (moderate-intensity exercise). ? At least 75 minutes of exercise that takes a lot of effort.  Do not use any products that contain nicotine or tobacco, such as cigarettes, e-cigarettes, and chewing tobacco. If you need help quitting, ask your doctor.  Do not drink alcohol if: ? Your doctor tells you not to drink. ? You are pregnant, may be pregnant, or are planning to become pregnant.  If you drink alcohol: ? Limit how much you use to:  0-1 drink a day for women.  0-2 drinks a day for men. ? Be aware of how much alcohol is in your drink. In the [place] one drink equals one 12  oz bottle of beer (355 mL), one 5 oz glass of wine (148 mL), or one 1 oz glass of hard liquor (44 mL).  Do not use drugs.  Manage your stress. Ask your doctor for tips on how to do this. General instructions  Take over-the-counter and prescription medicines only as told by your doctor.  Keep all follow-up visits as told by your doctor. This is important. Where to find more information  American Heart Association: www.heart.org Get help right away if:  You have any signs of a  stroke. "BE FAST" is an easy way to remember the main warning signs: ? B - Balance. Signs are dizziness, sudden trouble walking, or loss of balance. ? E - Eyes. Signs are trouble seeing or a change in how you see. ? F - Face. Signs are sudden weakness or loss of feeling of the face, or the face or eyelid drooping on one side. ? A - Arms. Signs are weakness or loss of feeling in an arm. This happens suddenly and usually on one side of the body. ? S - Speech. Signs are sudden trouble speaking, slurred speech, or trouble understanding what people say. ? T - Time. Time to call emergency services. Write down what time symptoms started.  You have other signs of a stroke, such as: ? A sudden, very bad headache with no known cause. ? Feeling like you may vomit (nausea). ? Vomiting. ? A seizure. These symptoms may be an emergency. Do not wait to see if the symptoms will go away. Get medical help right away. Call your local emergency services (911 in the [place] Do not drive yourself to the hospital. Summary  The carotid arteries are blood vessels on both sides of the neck.  If these arteries get smaller or get blocked, you are more likely to have a stroke or a mini-stroke.  This condition can be treated with lifestyle changes, medicines, surgery, or a blend of these treatments.  Get help right away if you have any signs of a stroke. "BE FAST" is an easy way to remember the main warning signs of stroke. This information is not intended to replace advice given to you by your health care provider. Make sure you discuss any questions you have with your health care provider. Document Revised: 07/31/2018 Document Reviewed: 07/31/2018 Elsevier Patient Education  [place].

## 2019-09-28 NOTE — Progress Notes (Addendum)
Progress Note    09/28/2019 7:16 AM 1 Day Post-Op  Subjective: Required fluid bolus for mild transient hypotension overnight.  Nursing also reported some difficulty with pills and liquids.  He states he was unable to tolerate solid foods last night.  He also tells me this is common for him since his neck surgery and radiation therapy.  Currently without complaints of nausea or vomiting.  Wife is at bedside   Vitals:   09/27/19 2344 09/28/19 0349  BP: 120/64 (!) 112/55  Pulse: 85 66  Resp: 18 13  Temp:  97.7 F (36.5 C)  SpO2: 95% 96%    Physical Exam: Cardiac: Heart rate and rhythm are regular Lungs: Bilateral rhonchi.  Good air movement.  No dyspnea Incisions: Right neck incision is well approximated without bleeding or hematoma Extremities: Moves all extremities well.  Bilateral 5/5 hand grip strength Neurologic: He is alert and oriented x4.  Cranial nerves II through XII intact.  Tongue is midline. HEENT: No evidence of tongue laceration or mucosal injury.  No bleeding.  CBC    Component Value Date/Time   WBC 12.3 (H) 09/28/2019 0430   RBC 3.41 (L) 09/28/2019 0430   HGB 10.9 (L) 09/28/2019 0430   HCT 32.7 (L) 09/28/2019 0430   PLT 287 09/28/2019 0430   MCV 95.9 09/28/2019 0430   MCH 32.0 09/28/2019 0430   MCHC 33.3 09/28/2019 0430   RDW 13.2 09/28/2019 0430   LYMPHSABS 1.7 09/24/2019 1258   MONOABS 1.1 (H) 09/24/2019 1258   EOSABS 0.1 09/24/2019 1258   BASOSABS 0.0 09/24/2019 1258    BMET    Component Value Date/Time   NA 139 09/28/2019 0430   K 4.4 09/28/2019 0430   CL 106 09/28/2019 0430   CO2 23 09/28/2019 0430   GLUCOSE 104 (H) 09/28/2019 0430   BUN 23 09/28/2019 0430   CREATININE 1.49 (H) 09/28/2019 0430   CALCIUM 9.1 09/28/2019 0430   GFRNONAA 49 (L) 09/28/2019 0430   GFRAA 57 (L) 09/28/2019 0430     Intake/Output Summary (Last 24 hours) at 09/28/2019 0716 Last data filed at 09/28/2019 0350 Gross per 24 hour  Intake 2850 ml  Output 1370 ml    Net 1480 ml    HOSPITAL MEDICATIONS Scheduled Meds: . aspirin  81 mg Oral Daily  . atorvastatin  80 mg Oral Daily  . clopidogrel  75 mg Oral Daily  . docusate sodium  100 mg Oral Daily  . enoxaparin (LOVENOX) injection  40 mg Subcutaneous Q24H   Continuous Infusions: . magnesium sulfate bolus IVPB     PRN Meds:.acetaminophen **OR** acetaminophen (TYLENOL) oral liquid 160 mg/5 mL **OR** acetaminophen, guaiFENesin-dextromethorphan, hydrALAZINE, labetalol, magnesium sulfate bolus IVPB, metoprolol tartrate, morphine injection, ondansetron, oxyCODONE-acetaminophen, phenol, potassium chloride, senna-docusate  Assessment: Status post right transcarotid artery stent secondary to symptomatic right carotid stenosis.  Postop day 1 hemodynamically stable.  Neurologically intact.  Dysphagia; patient reports chronic in nature.  Speech therapy to evaluate.  Has not been out of bed yet today.  Plan: -Await speech therapy evaluation and monitor mobility this morning.  Recommend continuing aspirin, statin and Plavix upon discharge -DVT prophylaxis: Lovenox   Risa Grill, PA-C [facility] [phone] 09/28/2019  7:16 AM   I have independently interviewed and examined patient and agree with PA assessment and plan above.  He will need to remain on aspirin, Plavix and statin.  I am unsure whether this patient will follow up with [place] as he is plan to move to  [place].  He may need to request our records to establish care in his new home town.  Yanett Conkright C. Donzetta Matters, MD [facility] [place] Office: [phone] Pager: [phone]

## 2019-09-28 NOTE — Progress Notes (Signed)
IV's and telemetry discontinued at this time. CCMD notified. Discharge instructions reviewed with patient and patient's wife. All questions answered.

## 2019-10-01 ENCOUNTER — Encounter (HOSPITAL_COMMUNITY): Payer: Self-pay | Admitting: Vascular Surgery

## 2019-10-01 LAB — POCT ACTIVATED CLOTTING TIME: Activated Clotting Time: 290 seconds

## 2019-10-05 ENCOUNTER — Other Ambulatory Visit: Payer: Self-pay

## 2019-10-05 DIAGNOSIS — I6521 Occlusion and stenosis of right carotid artery: Secondary | ICD-10-CM

## 2019-10-19 ENCOUNTER — Encounter: Admitting: Vascular Surgery

## 2019-10-19 ENCOUNTER — Inpatient Hospital Stay (HOSPITAL_COMMUNITY): Admission: RE | Admit: 2019-10-19 | Source: Ambulatory Visit

## 2022-01-25 IMAGING — MR MR MRA HEAD W/O CM
14 of 16 series · 36 of 48 positions shown · IV contrast (gadavist)
Comparison: None.

CLINICAL DATA: Right-sided facial numbness with vision changes.

EXAM:
MR HEAD WITHOUT CONTRAST
MR CIRCLE OF WILLIS WITHOUT CONTRAST
MRA OF THE NECK WITHOUT AND WITH CONTRAST
TECHNIQUE: Multiplanar, multiecho pulse sequences of the brain, circle of
willis and surrounding structures were obtained without intravenous
contrast. Angiographic images of the neck were obtained using MRA
technique without and with intravenous contrast.
CONTRAST:  6mL GADAVIST GADOBUTROL 1 MMOL/ML IV SOLN

[Series 5: DWI · axial · 3.0mm · 0.88mm/px · z∈[-67,+71]mm · 3 of 48 slices shown (1 of 4)]
[im 1/48]
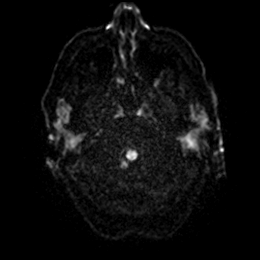
[im 24/48]
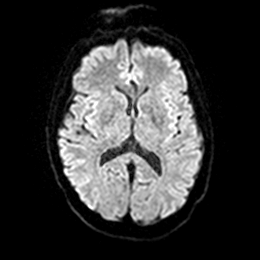
[im 48/48]
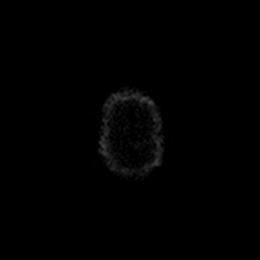

[Series 6: DWI · axial · 3.0mm · 0.88mm/px · z∈[-67,+71]mm · 2 of 43 slices shown (2 of 4)]
[im 1/43]
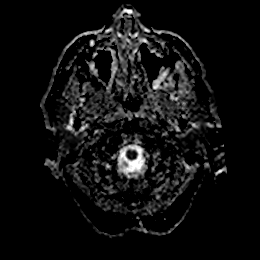
[im 43/43]
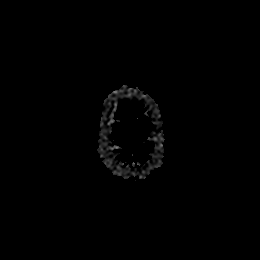

[Series 7: DWI · coronal · 4.0mm · 0.88mm/px · 2 of 34 slices shown (3 of 4)]
[im 1/34]
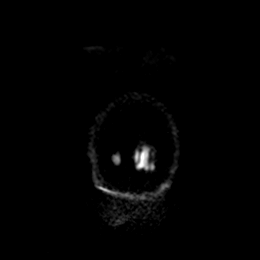
[im 34/34]
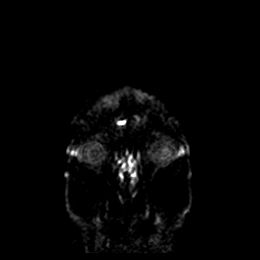

[Series 8: DWI · coronal · 4.0mm · 0.88mm/px · 2 of 33 slices shown (4 of 4)]
[im 1/33]
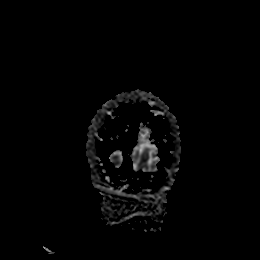
[im 33/33]
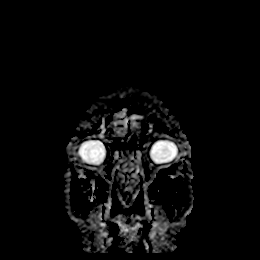

[Series 13: T1 · sagittal · 5.0mm · 0.75mm/px · 1 of 23 slices shown]
[im 1/23]
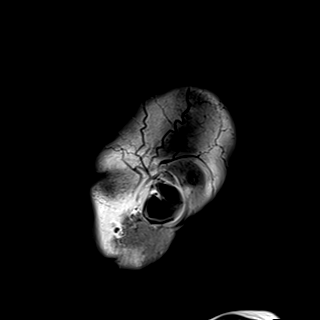

[Series 14: T2 · axial · 5.0mm · 0.72mm/px · 1 of 25 slices shown (1 of 2)]
[im 1/25]
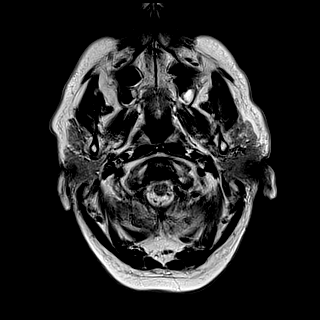

[Series 15: FLAIR · axial · 5.0mm · 0.45mm/px · 1 of 25 slices shown]
[im 1/25]
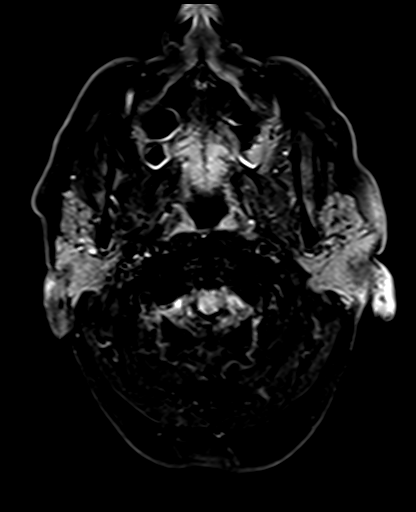

[Series 16: mag_images · axial · 3.0mm · 0.90mm/px · z∈[-75,+75]mm · 3 of 52 slices shown]
[im 1/52]
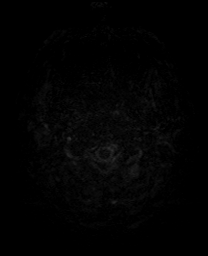
[im 26/52]
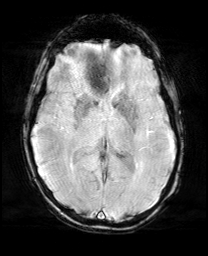
[im 52/52]
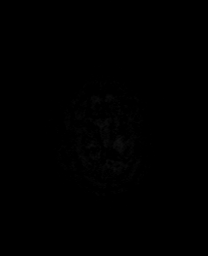

[Series 18: swi_images · axial · 3.0mm · 0.90mm/px · z∈[-75,+75]mm · 3 of 52 slices shown]
[im 1/52]
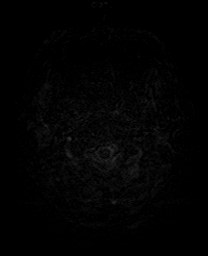
[im 26/52]
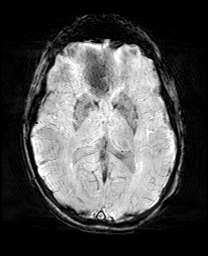
[im 52/52]
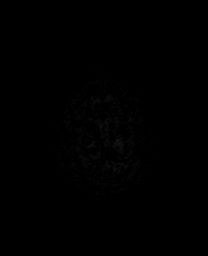

[Series 19: mip_images(sw) · axial · 24.0mm · 0.90mm/px · z∈[-64,+65]mm · 2 of 45 slices shown]
[im 1/45]
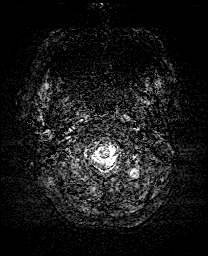
[im 45/45]
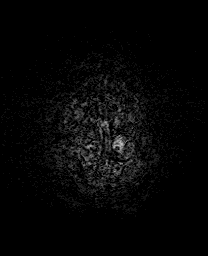

[Series 21: T2 · coronal · 5.0mm · 0.72mm/px · 1 of 28 slices shown (2 of 2)]
[im 1/28]
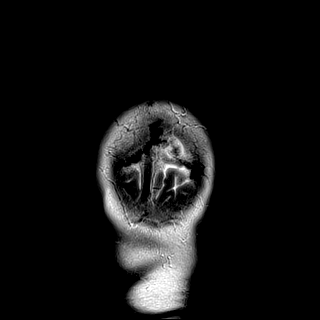

[Series 28: tof_fl3d_tra_iso · axial · 0.6mm · 0.52mm/px · z∈[-170,-91]mm · 7 of 133 slices shown]
[im 1/133]
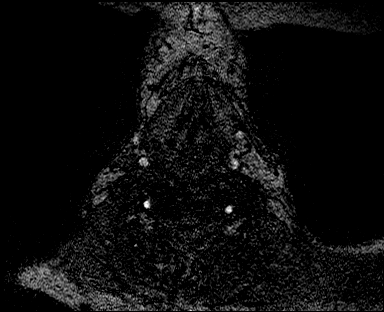
[im 23/133]
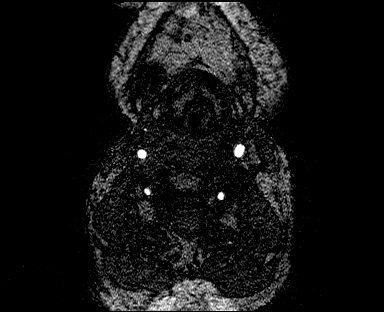
[im 45/133]
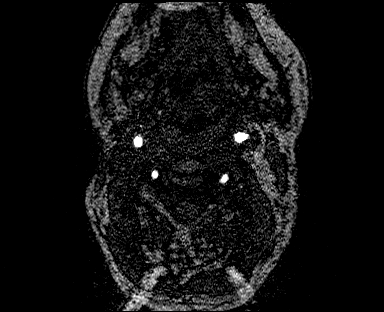
[im 67/133]
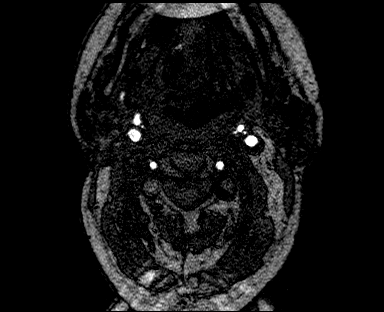
[im 89/133]
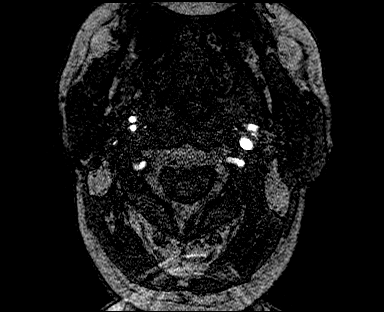
[im 111/133]
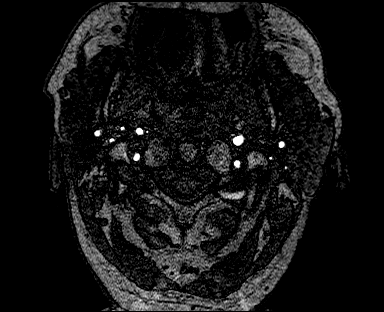
[im 133/133]
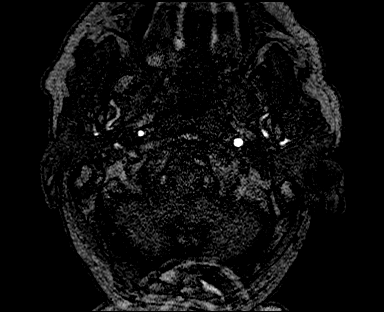

[Series 34: angio_fl3d_cor_post_ttc=3.0s_moco-adv · coronal · 0.9mm · 0.85mm/px · 4 of 80 slices shown]
[im 1/80]
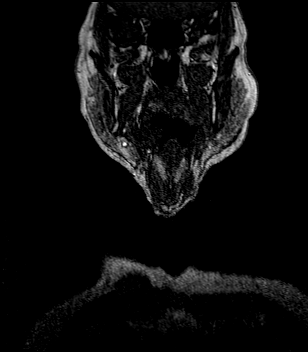
[im 27/80]
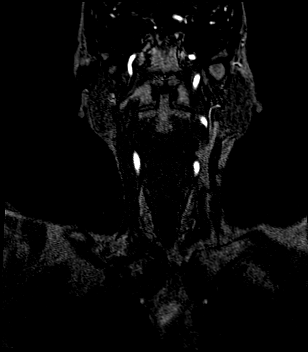
[im 53/80]
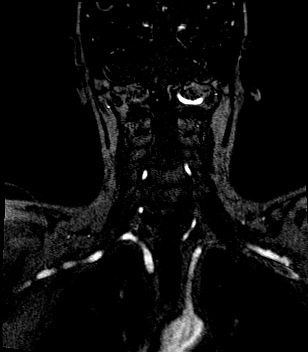
[im 80/80]
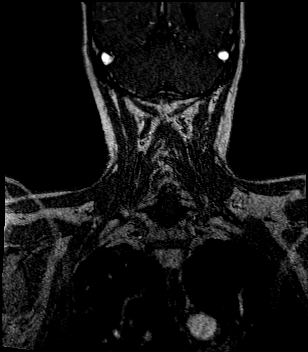

[Series 35: angio_fl3d_cor_post_ttc=3.0s_moco-adv_sub · coronal · 0.9mm · 0.85mm/px · 4 of 80 slices shown]
[im 1/80]
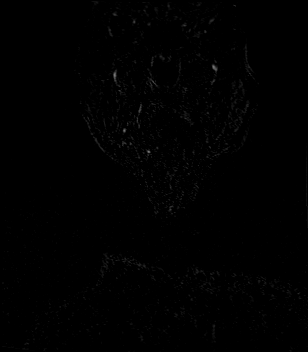
[im 27/80]
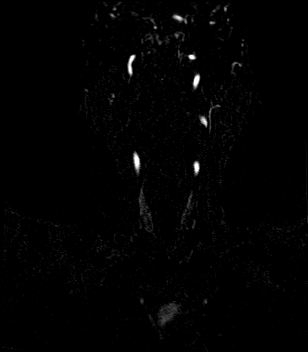
[im 53/80]
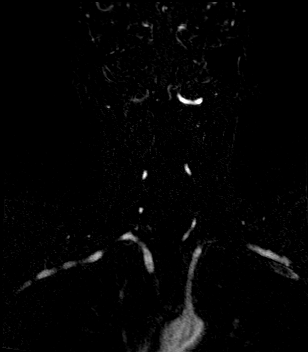
[im 80/80]
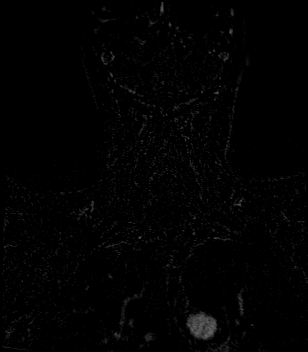

[36 of 48 positions shown; findings below may reference images not displayed]

FINDINGS: MR HEAD FINDINGS

Brain: No acute infarct, acute hemorrhage or extra-axial collection.
Multifocal hyperintense T2-weighted signal within the white matter.
Normal volume of CSF spaces. Single chronic microhemorrhage from the
left cerebellum. Normal midline structures.

Vascular: Normal flow voids.

Skull and upper cervical spine: Normal marrow signal.

Sinuses/Orbits: Negative.

Other: None.

MR CIRCLE OF WILLIS FINDINGS

POSTERIOR CIRCULATION:

--Vertebral arteries: Normal V4 segments.

--Inferior cerebellar arteries: Normal.

--Basilar artery: Normal.

--Superior cerebellar arteries: Normal.

--Posterior cerebral arteries: Normal.

ANTERIOR CIRCULATION:

--Intracranial internal carotid arteries: Normal.

--Anterior cerebral arteries (ACA): Normal. Both A1 segments are
present. Patent anterior communicating artery (a-comm).

--Middle cerebral arteries (MCA): Normal.

MRA NECK FINDINGS

There is severe stenosis of the proximal right internal carotid
artery (series 6270, image 165). The remainder of the ICA is normal.
The left carotid system is normal.

Vertebral system is codominant. There stenosis of the left vertebral
artery origin. Otherwise the vertebral arteries normal.
IMPRESSION: 1. No acute intracranial abnormality.
2. Normal intracranial MRA.
3. Severe stenosis of the proximal right internal carotid artery.
4. Left vertebral artery origin narrowing.
